# Patient Record
Sex: Male | Born: 1965 | Race: Black or African American | Hispanic: No | Marital: Married | Smoking: Never smoker
Health system: Southern US, Community
[De-identification: ages and names within clinical notes are randomized; demographics above are authoritative.]

## PROBLEM LIST (undated history)

## (undated) DIAGNOSIS — G4733 Obstructive sleep apnea (adult) (pediatric): Secondary | ICD-10-CM

## (undated) DIAGNOSIS — E669 Obesity, unspecified: Secondary | ICD-10-CM

## (undated) DIAGNOSIS — K219 Gastro-esophageal reflux disease without esophagitis: Secondary | ICD-10-CM

## (undated) DIAGNOSIS — Z9989 Dependence on other enabling machines and devices: Secondary | ICD-10-CM

## (undated) DIAGNOSIS — N289 Disorder of kidney and ureter, unspecified: Secondary | ICD-10-CM

## (undated) HISTORY — DX: Obesity, unspecified: E66.9

## (undated) HISTORY — DX: Dependence on other enabling machines and devices: Z99.89

## (undated) HISTORY — DX: Gastro-esophageal reflux disease without esophagitis: K21.9

## (undated) HISTORY — DX: Obstructive sleep apnea (adult) (pediatric): G47.33

---

## 1994-07-05 HISTORY — PX: TONSILLECTOMY AND ADENOIDECTOMY: SUR1326

## 2000-04-12 ENCOUNTER — Emergency Department (HOSPITAL_COMMUNITY): Admission: EM | Admit: 2000-04-12 | Discharge: 2000-04-12 | Payer: Self-pay | Admitting: Emergency Medicine

## 2001-01-09 ENCOUNTER — Emergency Department (HOSPITAL_COMMUNITY): Admission: EM | Admit: 2001-01-09 | Discharge: 2001-01-09 | Payer: Self-pay | Admitting: Emergency Medicine

## 2001-01-09 ENCOUNTER — Encounter: Payer: Self-pay | Admitting: Emergency Medicine

## 2001-01-13 ENCOUNTER — Emergency Department (HOSPITAL_COMMUNITY): Admission: EM | Admit: 2001-01-13 | Discharge: 2001-01-13 | Payer: Self-pay | Admitting: Emergency Medicine

## 2005-01-04 ENCOUNTER — Emergency Department (HOSPITAL_COMMUNITY): Admission: EM | Admit: 2005-01-04 | Discharge: 2005-01-04 | Payer: Self-pay | Admitting: Emergency Medicine

## 2005-10-13 ENCOUNTER — Encounter: Admission: RE | Admit: 2005-10-13 | Discharge: 2005-10-13 | Payer: Self-pay | Admitting: Gastroenterology

## 2007-03-19 IMAGING — CR DG BE W/ AIR HIGH DENSITY
8 of 10 series · 8 of 10 positions shown · non-contrast
Comparison: None.

CLINICAL DATA: Blood in the stools.
 DOUBLE CONTRAST BARIUM ENEMA WITH KUB:

[view not recorded (1 of 8)]
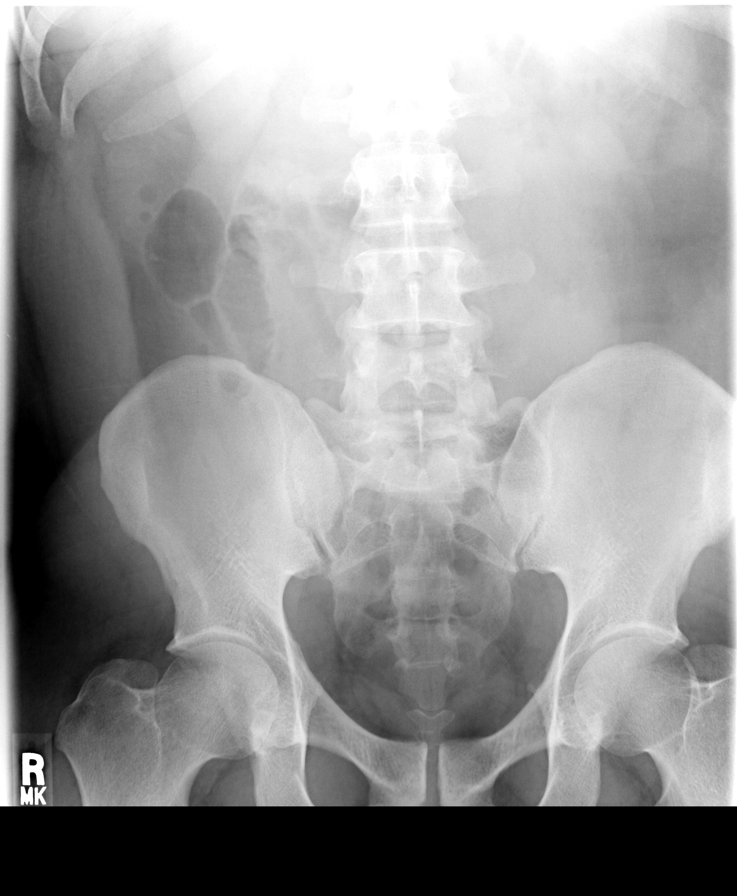

[view not recorded (2 of 8)]
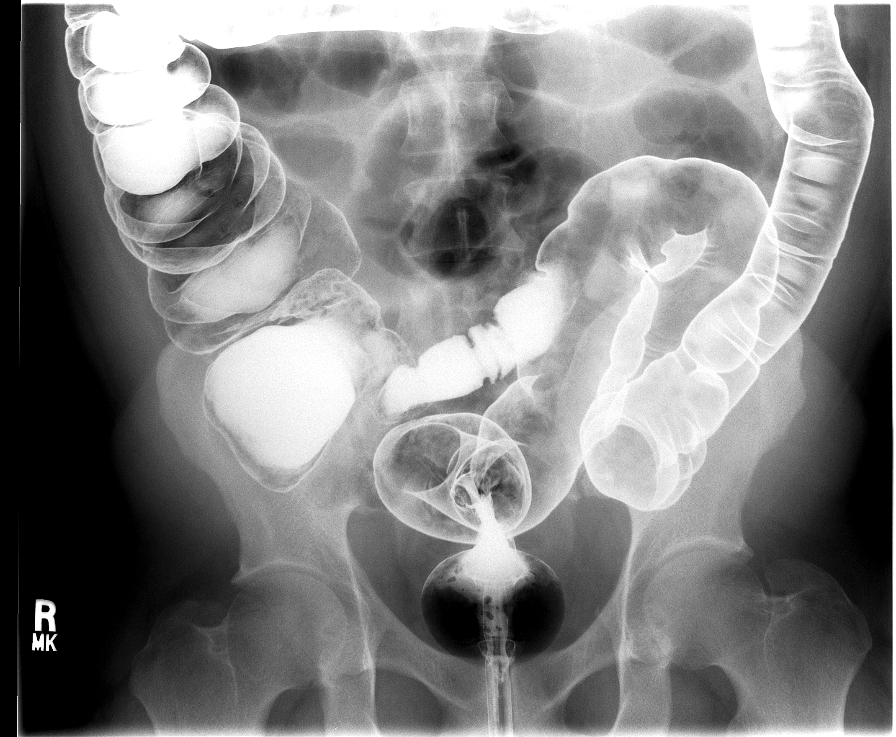

[view not recorded (3 of 8)]
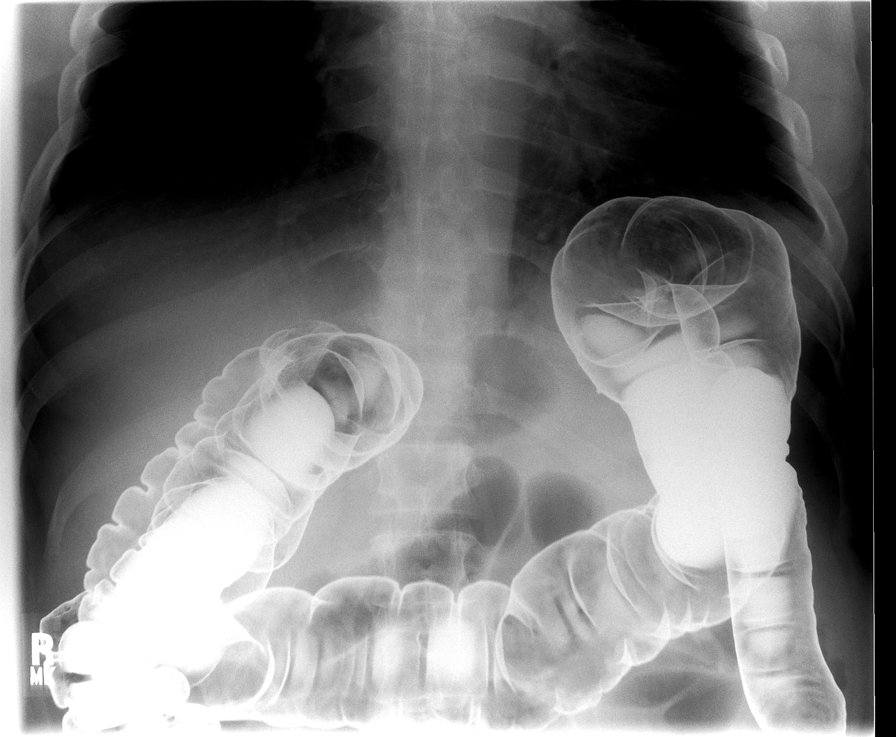

[view not recorded (4 of 8)]
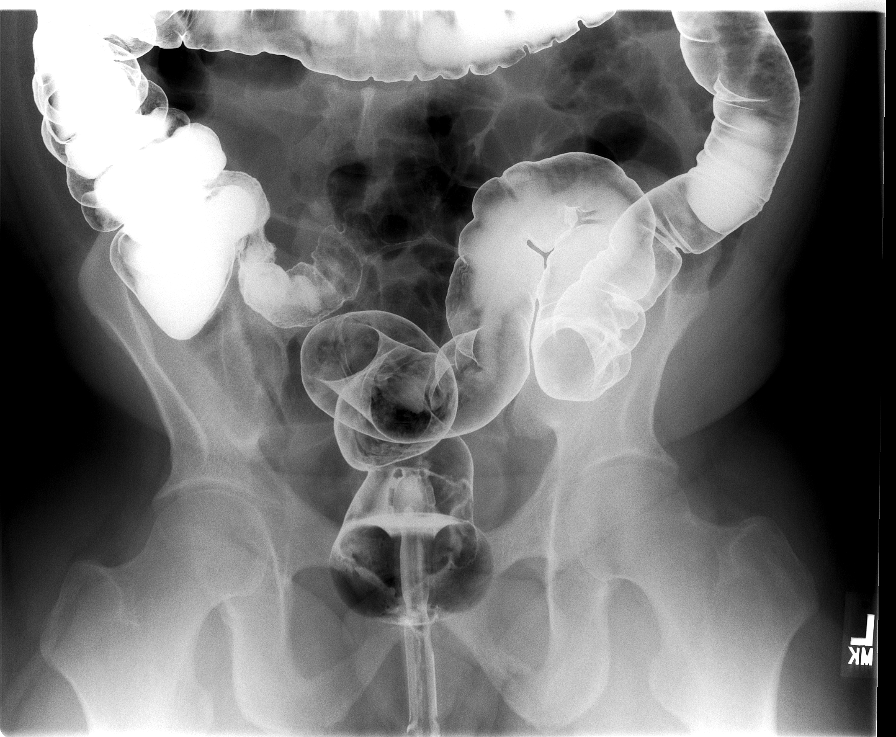

[view not recorded (5 of 8)]
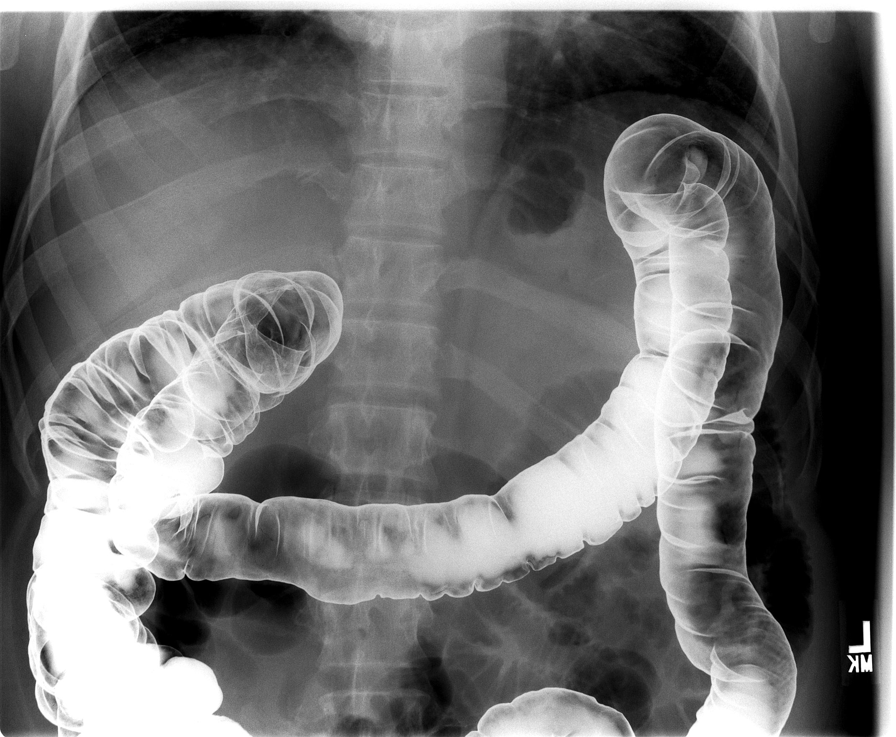

[view not recorded (6 of 8)]
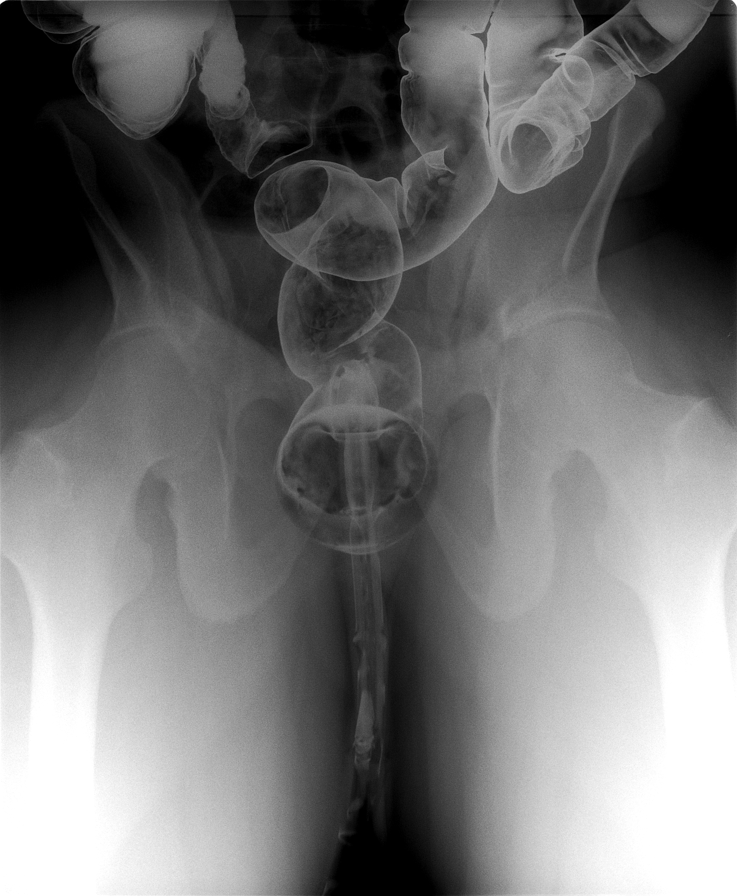

[view not recorded (7 of 8)]
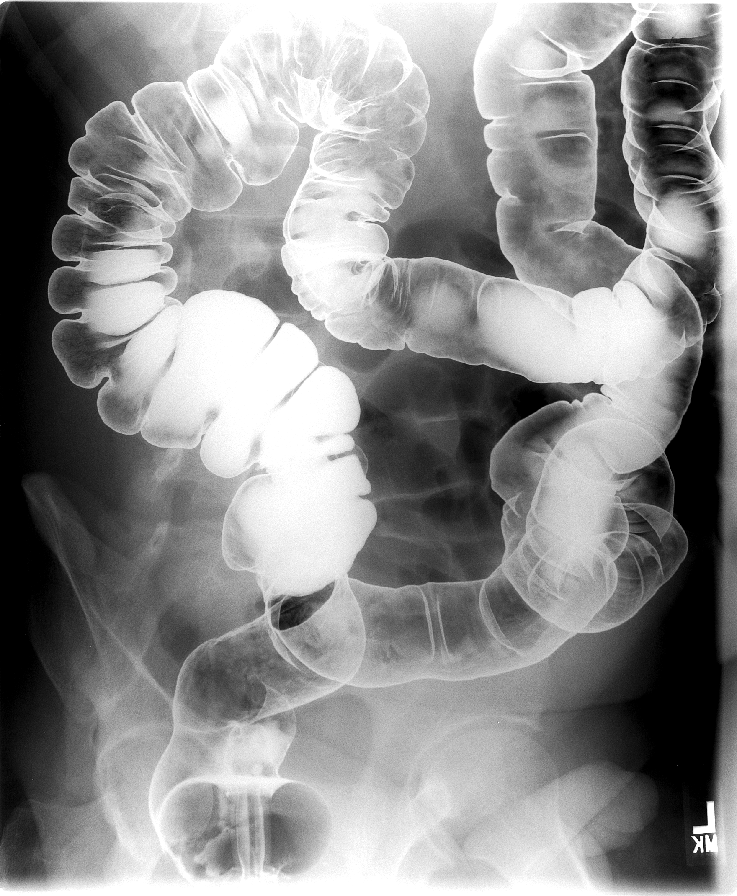

[view not recorded (8 of 8)]
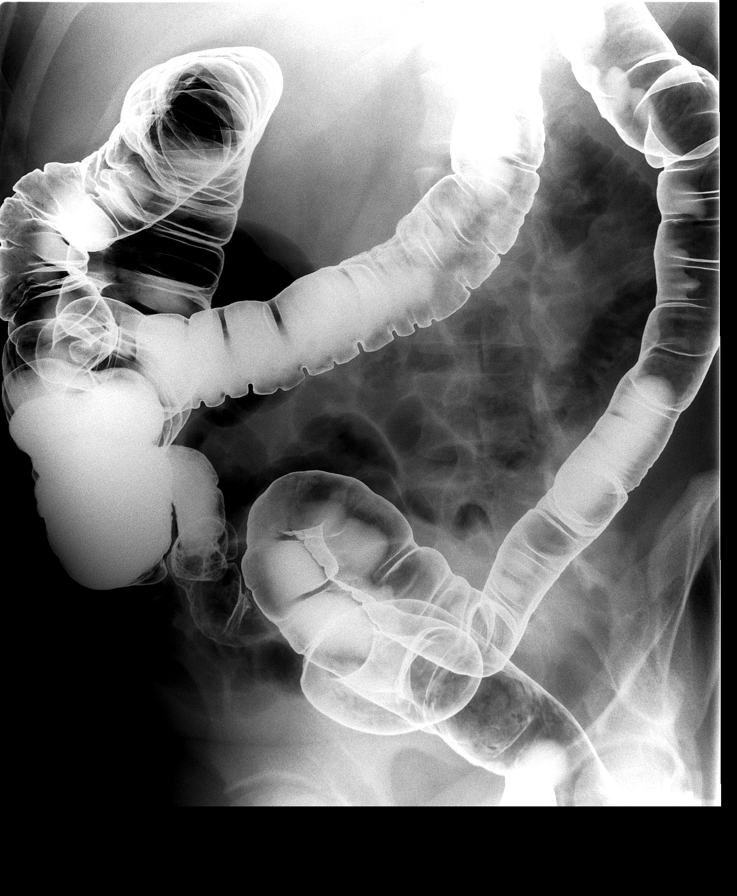

[8 of 10 positions shown; findings below may reference images not displayed]

FINDINGS: Initial KUB appears normal.  
 Double contrast barium enema was performed.  No polyp or mass is identified.  No ulceration or abnormality of the mucosal surface is detected.  The ileocecal valve region appears normal, as does the cecum.  Normal haustral pattern identified.  No significant abnormality of the sigmoid colon or rectum is detected.
IMPRESSION: Normal double contrast barium enema examination.  The cause of the patient's GI bleeding is occult.

## 2008-12-25 ENCOUNTER — Ambulatory Visit: Payer: Self-pay | Admitting: Internal Medicine

## 2008-12-25 ENCOUNTER — Inpatient Hospital Stay (HOSPITAL_COMMUNITY): Admission: EM | Admit: 2008-12-25 | Discharge: 2008-12-27 | Payer: Self-pay | Admitting: Emergency Medicine

## 2010-10-12 LAB — URINALYSIS, ROUTINE W REFLEX MICROSCOPIC
Bilirubin Urine: NEGATIVE
Glucose, UA: NEGATIVE mg/dL
Hgb urine dipstick: NEGATIVE
Hgb urine dipstick: NEGATIVE
Ketones, ur: NEGATIVE mg/dL
Nitrite: NEGATIVE
Protein, ur: NEGATIVE mg/dL
Protein, ur: NEGATIVE mg/dL
Specific Gravity, Urine: 1.026 (ref 1.005–1.030)
Urobilinogen, UA: 0.2 mg/dL (ref 0.0–1.0)
Urobilinogen, UA: 1 mg/dL (ref 0.0–1.0)
pH: 6 (ref 5.0–8.0)

## 2010-10-12 LAB — CBC
HCT: 36.1 % — ABNORMAL LOW (ref 39.0–52.0)
HCT: 47.2 % (ref 39.0–52.0)
Hemoglobin: 13.4 g/dL (ref 13.0–17.0)
Hemoglobin: 15.8 g/dL (ref 13.0–17.0)
MCHC: 33.3 g/dL (ref 30.0–36.0)
MCHC: 33.5 g/dL (ref 30.0–36.0)
MCV: 83.3 fL (ref 78.0–100.0)
MCV: 83.5 fL (ref 78.0–100.0)
MCV: 83.6 fL (ref 78.0–100.0)
Platelets: 274 10*3/uL (ref 150–400)
RBC: 4.32 MIL/uL (ref 4.22–5.81)
RBC: 4.83 MIL/uL (ref 4.22–5.81)
RBC: 5.66 MIL/uL (ref 4.22–5.81)
RDW: 14.3 % (ref 11.5–15.5)
WBC: 11.2 10*3/uL — ABNORMAL HIGH (ref 4.0–10.5)
WBC: 15.8 10*3/uL — ABNORMAL HIGH (ref 4.0–10.5)

## 2010-10-12 LAB — BASIC METABOLIC PANEL
BUN: 18 mg/dL (ref 6–23)
BUN: 19 mg/dL (ref 6–23)
CO2: 20 mEq/L (ref 19–32)
CO2: 21 mEq/L (ref 19–32)
CO2: 21 mEq/L (ref 19–32)
Calcium: 8.4 mg/dL (ref 8.4–10.5)
Chloride: 101 mEq/L (ref 96–112)
Chloride: 102 mEq/L (ref 96–112)
Chloride: 105 mEq/L (ref 96–112)
Creatinine, Ser: 1.43 mg/dL (ref 0.4–1.5)
Creatinine, Ser: 1.82 mg/dL — ABNORMAL HIGH (ref 0.4–1.5)
GFR calc Af Amer: 50 mL/min — ABNORMAL LOW (ref >60)
GFR calc Af Amer: >60 mL/min (ref >60)
GFR calc non Af Amer: 41 mL/min — ABNORMAL LOW (ref >60)
Glucose, Bld: 126 mg/dL — ABNORMAL HIGH (ref 70–99)
Glucose, Bld: 137 mg/dL — ABNORMAL HIGH (ref 70–99)
Potassium: 5.7 mEq/L — ABNORMAL HIGH (ref 3.5–5.1)
Potassium: 5.7 mEq/L — ABNORMAL HIGH (ref 3.5–5.1)
Sodium: 129 mEq/L — ABNORMAL LOW (ref 135–145)
Sodium: 130 mEq/L — ABNORMAL LOW (ref 135–145)

## 2010-10-12 LAB — DIFFERENTIAL
Basophils Absolute: 0 10*3/uL (ref 0.0–0.1)
Eosinophils Absolute: 0 10*3/uL (ref 0.0–0.7)
Lymphocytes Relative: 10 % — ABNORMAL LOW (ref 12–46)
Lymphocytes Relative: 18 % (ref 12–46)
Lymphs Abs: 1.1 10*3/uL (ref 0.7–4.0)
Monocytes Absolute: 0.5 10*3/uL (ref 0.1–1.0)
Monocytes Absolute: 0.6 10*3/uL (ref 0.1–1.0)
Monocytes Relative: 4 % (ref 3–12)
Monocytes Relative: 4 % (ref 3–12)
Neutro Abs: 9.6 10*3/uL — ABNORMAL HIGH (ref 1.7–7.7)

## 2010-10-12 LAB — COMPREHENSIVE METABOLIC PANEL
BUN: 18 mg/dL (ref 6–23)
CO2: 23 mEq/L (ref 19–32)
Chloride: 109 mEq/L (ref 96–112)
Creatinine, Ser: 1.28 mg/dL (ref 0.4–1.5)
GFR calc non Af Amer: >60 mL/min (ref >60)
Glucose, Bld: 140 mg/dL — ABNORMAL HIGH (ref 70–99)
Total Bilirubin: 0.3 mg/dL (ref 0.3–1.2)

## 2010-10-12 LAB — URINE CULTURE

## 2010-11-17 NOTE — Discharge Summary (Signed)
Marvin Gonzalez, MAN NO.:  [id number]   MEDICAL RECORD NO.:  [id number]          PATIENT TYPE:  INP   LOCATION:  1407                         FACILITY:  [facility]   PHYSICIAN:  Ramiro Harvest, MD    DATE OF BIRTH:  June 11, 1966   DATE OF ADMISSION:  12/25/2008  DATE OF DISCHARGE:  12/27/2008                               DISCHARGE SUMMARY   PRIMARY CARE PHYSICIAN:  Dr. Crawford Givens of [facility].   DISCHARGE DIAGNOSES:  1. Probable allergic reaction.  2. Hyponatremia, resolved.  3. Acute renal insufficiency, resolved.  4. Dehydration.  5. Left cheek abscess.  6. Leukocytosis, steroid-induced.  7. Hyperkalemia, resolved.   DISCHARGE MEDICATIONS:  1. Prednisone 60 mg p.o. b.i.d. x2 days, then 60 mg p.o. daily x2      days, then 40 mg p.o. daily x2 days, then 20 mg p.o. daily x2 days.  2. Pepcid 20 mg p.o. b.i.d. x3 days.  3. Benadryl 25 mg p.o. b.i.d. x3 days.  4. Motrin 200 mg q.i.d. p.r.n.  5. Doxycycline 100 mg p.o. b.i.d. x5 more days.   DISPOSITION AND FOLLOW-UP:  The patient will be discharged home.  The  patient is to follow up with his PCP in 1 week.  On follow-up a basic  metabolic profile needs to be checked to follow up on the patient's  electrolytes and renal function.  a CBC will also need to be checked to  follow up on the patient's leukocytosis, post-discharge, as this was  felt to be steroid-induced.   CONSULTATIONS DONE:  None.   PROCEDURES PERFORMED.:  A chest x-ray was done on December 27, 2008 that  showed no acute disease.   BRIEF ADMISSION HISTORY AND PHYSICAL:  [name] is a 45 year old  gentleman who presented to his PCP on December 16, 2008, secondary to an  abscess on his left cheek.  The patient was started on Bactrim, took  that for about 7 days and then he developed hives on December 23, 2008 in  the evening, went to the Urgent Care Center on June 22.  At the Urgent  Care Center, the patient was given a steroid injection,  switched to oral  doxycycline.  He went home and took his as needed Benadryl and Motrin  due to a temperature of 100.  The night prior to admission the patient  developed peripheral swelling, as well as some leg swelling and felt  like a lump in his throat.  The patient developed some shortness of  breath on exertion.  He presented to the ED for further evaluation and  treatment.   PHYSICAL EXAM:  Per admitting physician.  VITAL SIGNS:  Temperature of 99.4, blood pressure 117/48, heart rate of  104, respirations 16, satting 95% on room air.  GENERAL: The patient is an obese African American male in no acute  distress.  HEENT: Normocephalic, atraumatic.  Pupils equal, round and reactive to  light and accommodation.  Extraocular movements intact.  Oropharynx  clear.  No lesions.  No exudates.  NECK: Supple.  No lymphadenopathy.  RESPIRATORY:  Clear  breath sounds bilaterally throughout.  No wheezes,  no rales, no rhonchi.  No increased work of breathing.  CARDIOVASCULAR:  Tachycardiac, regular rhythm.  ABDOMEN:  Soft, nontender, nondistended, positive bowel sounds.  PSYCHIATRIC:  The patient was alert and oriented x3, calm and pleasant.  NEUROLOGICALLY:  The patient was moving all extremities.  Speech was  clear.  Cranial nerves II-XII are grossly intact.  SKIN: Large hives were noted to be scattered diffusely, including his  back and his legs.  EXTREMITIES:  He had positive peripheral edema as noted especially in  his hands.  ENT:  Mild lip-swelling noted.  No tongue swelling.  A small area of  induration of approximately 1 inch on his left cheek without drainage.   ADMISSION LABS:  Please see history and physical that was dictated.   HOSPITAL COURSE:  1. Probable allergic reaction:  The patient was admitted with a      probable allergic reaction, felt to be secondary to his Bactrim.      He was admitted to monitor him for angioedema.  The patient was      placed on IV Solu-Medrol,  IV Pepcid and IV Benadryl, as well as      supportive care with IV fluids.  The patient was monitored and      improved clinically on a daily basis.  On hospital day #2 the      patient did complain of some itching, also did have some swelling      in his hands.  However, his lip swelling had improved and he felt      that he was improving slowly.  The patient's Solu-Medrol was      subsequently tapered.  He was maintained on his IV Pepcid and      Benadryl and monitored.  The patient remained afebrile throughout      the hospitalization and a CBC obtained showed increase in his white      count.  A urinalysis and chest x-ray were obtained which were      negative and it was felt the patient's leukocytosis was likely      secondary to his steroids.  The patient improved clinically during      the hospitalization such that by day of discharge the patient was      in a stable and improved condition.  The patient's lip-swelling had      resolved.  The patient's peripheral edema, especially in his hands,      had improved significantly.  The patient was no longer itching and      the patient will be discharged home on a steroid taper, as well as      the Pepcid and Benadryl taper to follow up with his PCP in 1 week      post-discharge.  2. Hyponatremia.  The patient was admitted, noted on admission to be      hyponatremic with a sodium of 129.  It was felt the patient's      hyponatremia was likely secondary to hypovolemic hyponatremia.  A      TSH was obtained which was within normal limits.  A random cortisol      level was obtained which was also within normal limits.  Chest x-      ray which was obtained, as stated above, was negative.  The patient      was hydrated with IV fluids during the hospitalization with      improvement  in his sodium levels such that by day of discharge, the      patient's hyponatremia had resolved and his sodium was 136 by day      of discharge.  The patient  will need a follow-up BMET with his PCP      on follow-up.  3. Dehydration.  On admission, the patient was noted to be also      dehydrated with borderline low blood pressures.  The patient was      hydrated with IV fluids and was euvolemic by day of discharge.  4. Acute renal insufficiency:  On admission the patient was noted to      be in acute renal insufficiency with a creatinine of 1.82 and a GFR      of 50.  It was felt this was prerenal in nature.  The patient was      hydrated with IV fluids with improvement in his renal function such      that, by day of discharge, the patient's acute renal insufficiency      had resolved.  His creatinine on day of discharge was 1.28 with a      GFR of greater than 60.  5. Left cheek abscess:  This remained stable during the      hospitalization, improved clinically.  The patient was maintained      on doxycycline that he had been taking orally as an outpatient.      The patient will be discharged back home to complete a 5-day course      of his doxycycline with followup with his PCP as an outpatient.  6. Leukocytosis:  On hospital day #2, the patient was noted to have a      leukocytosis with an increase in his white count of up to 15.8.  A      urinalysis was obtained which was within normal limits.  Chest x-      ray was also obtained which was negative for any infiltrate.  The      patient was asymptomatic, afebrile, and it was felt that the      patient's leukocytosis was likely steroid-induced.  This should      improve as the patient is tapered off his steroids and repeat CBC      will need to be obtained on follow-up to monitor his leukocytosis.   On day of discharge the patient was in stable and improved condition and  will be discharged home in stable condition with close follow-up with  PCP.   DISCHARGE VITAL SIGNS:  Temperature 98.0, blood pressure 113/39, pulse  of 87, respirations 20, satting 97% on room air.   DISCHARGE LABS:   CBC with white count 15.8, hemoglobin 12.1, platelets  of 230, hematocrit 36.1.  Sodium of 136, potassium 4.6, chloride 109,  bicarb 23, BUN 18, creatinine 1.28, glucose of 140, calcium of 7.4,  bilirubin of 0.3, alk phosphatase 41, AST 19, ALT 20, protein of 5.0,  albumin of 2.4.  TSH of 0.406.  Random cortisol level of 17.1.  Chest x-  ray with no acute cardiopulmonary disease.  Urinalysis was nitrite  negative and leukocytes negative.   It was a pleasure taking care of [name].      Ramiro Harvest, MD  Electronically Signed     DT/MEDQ  D:  12/27/2008  T:  12/27/2008  Job:  [id number]   cc:   Dwana Curd. Para March, M.D.  Fax: [phone]

## 2010-11-17 NOTE — H&P (Signed)
Marvin Gonzalez, Marvin Gonzalez                ACCOUNT NO.:  [id number]   MEDICAL RECORD NO.:  [id number]          PATIENT TYPE:  EMS   LOCATION:  ED                           FACILITY:  [facility]   PHYSICIAN:  Raenette Rover. Felicity Coyer, MDDATE OF BIRTH:  11/06/1965   DATE OF ADMISSION:  12/25/2008  DATE OF DISCHARGE:                              HISTORY & PHYSICAL   CHIEF COMPLAINT:  Allergic reaction.   HISTORY OF PRESENT ILLNESS:  Mr. Conley is a 45 year old male who  presented to his primary care on December 16, 2008 secondary to an abscess  on his left cheek.  He was started on Bactrim and took this for 7 days.  He then developed hives on December 23, 2008 in the evening and went to the  urgent care on June 22.  At urgent care, he was given a steroid  injection and switched to oral doxycycline.  He went home and took  p.r.n. Benadryl and Motrin due to a temperature of 100.  Last night, he  then developed peripheral swelling as well as some lid swelling and felt  like a lump in his throat.  He developed some dyspnea with exertion.  He presents here to the emergency department for further evaluation and  treatment.   ALLERGIES:  BACTRIM.   PAST MEDICAL HISTORY:  1. GERD.  2. Obesity.   PAST SURGICAL HISTORY:  Tonsillectomy and adenoidectomy in 1996.   SOCIAL HISTORY:  Patient is married.  He has 3 children.  He works as a  Engineer, civil (consulting).  He denies tobacco, drugs, or alcohol use.   FAMILY HISTORY:  His mother is alive.  She is alive and well.  Father is  also alive.  He has chronic heart problems, glaucoma, and diabetes.  Reports his children are alive and well.  His maternal grandfather had  heart problems, and his maternal grandmother had thyroid problems as  well as diabetes type 2.   MEDICATIONS:  1. Motrin 200 mg p.o. q.i.d. p.r.n.  2. Benadryl 25 mg p.o. p.r.n.  3. Doxycycline 100 mg p.o. b.i.d.   REVIEW OF SYSTEMS:  ENT:  Reports positive swelling of his lips and some  throat fullness.  GENERAL:   Temp is 100.  CARDIOVASCULAR:  No chest  pain.  RESPIRATORY:  Positive shortness of breath with exertion.  GI:  Positive nausea.  No vomiting.  Did have a loose stool last night as  well as this morning.  MUSCULOSKELETAL:  No muscular or joint pain.  PSYCHIATRIC:  Denies depression or anxiety.  NEURO:  No vision or  hearing changes.  SKIN:  Notes diffuse hives.  HEME:  No bruising, no  bleeding.   LABS/RADIOLOGY:  Sodium 129, potassium 5.7, chloride 101, bicarb 20, BUN  19, creatinine 1.86, glucose 126.  White blood cell count 0.2,  hemoglobin 15.8, hematocrit 47.2, platelets 274.  Urinalysis negative.   PHYSICAL EXAMINATION:  BP 117/48, heart rate 104, respiratory rate 16,  temperature 99.4, O2 95% on room air.  GENERAL:  He is an obese African American male in no acute distress.  EYES:  No  discharge.  Nonicteric.  CARDIOVASCULAR:  S1 and S2.  Mild tachycardia.  RESPIRATORY:  Breath sounds clear to auscultation bilaterally without  wheezes, rales or rhonchi.  No increased work of breathing.  ABDOMEN:  Soft, nontender, nondistended.  Positive bowel sounds.  PSYCHIATRIC:  A and O x3.  Calm and pleasant.  NEURO:  Moving all extremities.  Speech is clear.  Cranial nerves II-XII  grossly intact.  SKIN:  Large hives noted to be scattered diffusely, including back and  legs.  EXTREMITIES:  Positive peripheral edema is noted, especially in the  hands.  ENT:  Mild lip swelling noted.  No tongue swelling.  A small area of  induration of approximately 1 inch, left cheek, without drainage.   ASSESSMENT/PLAN:  1. Allergic reaction to Bactrim, rule out early angioedema.  Will      treat with IV Solu-Medrol, IV Pepcid, IV Benadryl, monitor on      telemetry bed.  2. Abscess, left cheek.  This is improving, per the patient, but it      remains itchy.  We will continue the patient on oral doxycycline.  3. Hyponatremia, likely secondary to edema.  We will hydrate with      __________.  4.  Hyperkalemia:  Question if this is a hemolyzed sample.  Will repeat      BMET.      Sandford Craze, NP      Raenette Rover. Felicity Coyer, MD  Electronically Signed    MO/MEDQ  D:  12/25/2008  T:  12/25/2008  Job:  [id number]   cc:   Dwana Curd. Para March, M.D.  Fax: [phone]

## 2014-11-21 ENCOUNTER — Other Ambulatory Visit: Payer: Self-pay | Admitting: Family Medicine

## 2014-11-21 DIAGNOSIS — R7989 Other specified abnormal findings of blood chemistry: Secondary | ICD-10-CM

## 2014-11-29 ENCOUNTER — Ambulatory Visit
Admission: RE | Admit: 2014-11-29 | Discharge: 2014-11-29 | Disposition: A | Discharge status: Home / Self Care | Payer: No Typology Code available for payment source | Source: Ambulatory Visit | Attending: Family Medicine | Admitting: Family Medicine

## 2014-11-29 DIAGNOSIS — R7989 Other specified abnormal findings of blood chemistry: Secondary | ICD-10-CM

## 2021-01-03 ENCOUNTER — Other Ambulatory Visit: Payer: Self-pay

## 2021-01-03 ENCOUNTER — Encounter: Payer: Self-pay | Admitting: Emergency Medicine

## 2021-01-03 ENCOUNTER — Ambulatory Visit
Admission: EM | Admit: 2021-01-03 | Discharge: 2021-01-03 | Disposition: A | Discharge status: Home / Self Care | Payer: No Typology Code available for payment source | Attending: Family Medicine | Admitting: Family Medicine

## 2021-01-03 DIAGNOSIS — R0981 Nasal congestion: Secondary | ICD-10-CM

## 2021-01-03 DIAGNOSIS — J069 Acute upper respiratory infection, unspecified: Secondary | ICD-10-CM | POA: Diagnosis not present

## 2021-01-03 MED ORDER — PREDNISONE 20 MG PO TABS: 40.0000 mg | ORAL_TABLET | Freq: Every day | ORAL | 0 refills | Status: DC

## 2021-01-03 NOTE — ED Triage Notes (Signed)
Patient c/o nasal congestion x 3 days.   Patient endorses a fever with a highest temperature of 102 F at home.   Patient endorses a productive cough w/ "brown, thick" mucus.   Patient has a rapid COVID-19 test at work with a negative test result.   Patient endorses decreased appetitive, stating " I haven't eaten since Wednesday".   Patient has taken OTC "cold and sinus" medication w/ some relief of symptoms.

## 2021-01-05 LAB — NOVEL CORONAVIRUS, NAA: SARS-CoV-2, NAA: DETECTED — AB

## 2021-01-05 LAB — SARS-COV-2, NAA 2 DAY TAT

## 2021-01-05 NOTE — ED Provider Notes (Signed)
[facility]   [id number] 01/03/21 Arrival Time: 1036  ASSESSMENT & PLAN:  1. Viral URI   2. Nasal congestion    Discussed typical duration of obvious viral illnesses. COVID-19 testing sent. OTC symptom care as needed.  Given amount of nasal congestion, begin trial of: Meds ordered this encounter  Medications   predniSONE (DELTASONE) 20 MG tablet    Sig: Take 2 tablets (40 mg total) by mouth daily.    Dispense:  10 tablet    Refill:  0   Understands steroids will raise blood sugar.  Recommend:  Follow-up Information     [facility] [place].   Specialty: Family Medicine Why: If worsening or failing to improve as anticipated. Contact information: [address] [place] [address] [phone]                 Reviewed expectations re: course of current medical issues. Questions answered. Outlined signs and symptoms indicating need for more acute intervention. Understanding verbalized. After Visit Summary given.   SUBJECTIVE: History from: patient. Marvin Gonzalez is a 55 y.o. male who reports nasal congestion and coughing; abrupt onset 3 d ago. T 102 F yesterday. Rapid COVID at work yesterday; negative. Overall decreased appetite. No n/v/d. OTC medications without much relief. Denies: difficulty breathing. Normal PO intake without n/v/d.   OBJECTIVE:  Vitals:   01/03/21 1213 01/03/21 1222  BP: 113/73   Pulse: 83   Resp: 18   Temp: 97.9 F (36.6 C)   TempSrc: Oral   SpO2: 97%   Weight:  117.9 kg  Height:  5\' 7"  (1.702 m)    General appearance: alert; no distress Eyes: PERRLA; EOMI; conjunctiva normal HENT: [place]; AT; with nasal congestion Neck: supple  Lungs: speaks full sentences without difficulty; unlabored; dry cough Extremities: no edema Skin: warm and dry Neurologic: normal gait Psychological: alert and cooperative; normal mood and affect  Labs: Labs Reviewed  NOVEL CORONAVIRUS, NAA    Allergies   Allergen Reactions   Sulfa Antibiotics Rash    Past Medical History:  Diagnosis Date   GERD (gastroesophageal reflux disease)    Obesity    OSA on CPAP    Social History   Socioeconomic History   Marital status: Married    Spouse name: Not on file   Number of children: Not on file   Years of education: Not on file   Highest education level: Not on file  Occupational History   Not on file  Tobacco Use   Smoking status: Never   Smokeless tobacco: Never  Substance and Sexual Activity   Alcohol use: Not on file   Drug use: Not on file   Sexual activity: Not on file  Other Topics Concern   Not on file  Social History Narrative   Not on file   Social Determinants of Health   Financial Resource Strain: Not on file  Food Insecurity: Not on file  Transportation Needs: Not on file  Physical Activity: Not on file  Stress: Not on file  Social Connections: Not on file  Intimate Partner Violence: Not on file   Family History  Problem Relation Age of Onset   Diabetes Mellitus I Father    Hypertension Father    Coronary artery disease Father    Diabetes Mellitus I Sister    Hypertension Sister    Past Surgical History:  Procedure Laterality Date   TONSILLECTOMY AND ADENOIDECTOMY  1996     , MD  01/05/21 1102  

## 2021-09-11 ENCOUNTER — Ambulatory Visit
Admission: EM | Admit: 2021-09-11 | Discharge: 2021-09-11 | Disposition: A | Discharge status: Home / Self Care | Payer: No Typology Code available for payment source | Attending: Internal Medicine | Admitting: Internal Medicine

## 2021-09-11 ENCOUNTER — Other Ambulatory Visit: Payer: Self-pay

## 2021-09-11 DIAGNOSIS — J069 Acute upper respiratory infection, unspecified: Secondary | ICD-10-CM | POA: Diagnosis not present

## 2021-09-11 MED ORDER — BENZONATATE 100 MG PO CAPS: 100.0000 mg | ORAL_CAPSULE | Freq: Three times a day (TID) | ORAL | 0 refills | Status: DC | PRN

## 2021-09-11 MED ORDER — PREDNISONE 20 MG PO TABS: 40.0000 mg | ORAL_TABLET | Freq: Every day | ORAL | 0 refills | Status: AC

## 2021-09-11 NOTE — ED Triage Notes (Signed)
Pt c/o cough, nasal congestion, chest tightness,  ? ?Onset ~ last night  ?

## 2021-09-11 NOTE — ED Provider Notes (Signed)
?[facility] ? ? ? ?CSN: [id number] ?Arrival date & time: 09/11/21  1109 ? ? ?  ? ?History   ?Chief Complaint ?Chief Complaint  ?Patient presents with  ? Cough  ? ? ?HPI ?Marvin Gonzalez is a 56 y.o. male.  ? ?Patient presents with nonproductive cough and nasal congestion that has been present for a few days.  Cough developed yesterday.  Patient denies any known fevers or sick contacts.  Denies chest pain, shortness of breath, ear pain, sore throat, nausea, vomiting, diarrhea, abdominal pain.  Patient does not report taking any medications to alleviate symptoms. ? ? ?Cough ? ?Past Medical History:  ?Diagnosis Date  ? GERD (gastroesophageal reflux disease)   ? Obesity   ? OSA on CPAP   ? ? ?There are no problems to display for this patient. ? ? ?Past Surgical History:  ?Procedure Laterality Date  ? TONSILLECTOMY AND ADENOIDECTOMY  1996  ? ? ? ? ? ?Home Medications   ? ?Prior to Admission medications   ?Medication Sig Start Date End Date Taking? Authorizing Provider  ?benzonatate (TESSALON) 100 MG capsule Take 1 capsule (100 mg total) by mouth every 8 (eight) hours as needed for cough. 09/11/21  Yes Gustavus BryantMound, Deseree Zemaitis E, FNP  ?predniSONE (DELTASONE) 20 MG tablet Take 2 tablets (40 mg total) by mouth daily for 5 days. 09/11/21 09/16/21 Yes Marilynn Ekstein, Acie FredricksonHaley E, FNP  ?allopurinol (ZYLOPRIM) 300 MG tablet Take 300 mg by mouth daily. 09/20/20   [provider]  ?pantoprazole (PROTONIX) 40 MG tablet Take 40 mg by mouth daily.    [provider]  ?spironolactone (ALDACTONE) 25 MG tablet Take 25 mg by mouth daily. 11/20/20   [provider]  ? ? ?Family History ?Family History  ?Problem Relation Age of Onset  ? Diabetes Mellitus I Father   ? Hypertension Father   ? Coronary artery disease Father   ? Diabetes Mellitus I Sister   ? Hypertension Sister   ? ? ?Social History ?Social History  ? ?Tobacco Use  ? Smoking status: Never  ? Smokeless tobacco: Never  ? ? ? ?Allergies   ?Sulfa antibiotics ? ? ?Review of  Systems ?Review of Systems ?Per HPI ? ?Physical Exam ?Triage Vital Signs ?ED Triage Vitals  ?Enc Vitals Group  ?   BP 09/11/21 1208 (!) 164/82  ?   Pulse Rate 09/11/21 1208 93  ?   Resp 09/11/21 1208 18  ?   Temp 09/11/21 1208 98.9 ?F (37.2 ?C)  ?   Temp Source 09/11/21 1208 Oral  ?   SpO2 09/11/21 1208 96 %  ?   Weight --   ?   Height --   ?   Head Circumference --   ?   Peak Flow --   ?   Pain Score 09/11/21 1209 0  ?   Pain Loc --   ?   Pain Edu? --   ?   Excl. in GC? --   ? ?No data found. ? ?Updated Vital Signs ?BP (!) 164/82 (BP Location: Right Arm)   Pulse 93   Temp 98.9 ?F (37.2 ?C) (Oral)   Resp 18   SpO2 96%  ? ?Visual Acuity ?Right Eye Distance:   ?Left Eye Distance:   ?Bilateral Distance:   ? ?Right Eye Near:   ?Left Eye Near:    ?Bilateral Near:    ? ?Physical Exam ?Constitutional:   ?   General: He is not in acute distress. ?   Appearance:  Normal appearance. He is not toxic-appearing or diaphoretic.  ?HENT:  ?   Head: Normocephalic and atraumatic.  ?   Right Ear: Tympanic membrane and ear canal normal.  ?   Left Ear: Tympanic membrane and ear canal normal.  ?   Nose: Congestion present.  ?   Mouth/Throat:  ?   Mouth: Mucous membranes are moist.  ?   Pharynx: No posterior oropharyngeal erythema.  ?Eyes:  ?   Extraocular Movements: Extraocular movements intact.  ?   Conjunctiva/sclera: Conjunctivae normal.  ?   Pupils: Pupils are equal, round, and reactive to light.  ?Cardiovascular:  ?   Rate and Rhythm: Normal rate and regular rhythm.  ?   Pulses: Normal pulses.  ?   Heart sounds: Normal heart sounds.  ?Pulmonary:  ?   Effort: Pulmonary effort is normal. No respiratory distress.  ?   Breath sounds: Normal breath sounds. No stridor. No wheezing, rhonchi or rales.  ?Abdominal:  ?   General: Abdomen is flat. Bowel sounds are normal.  ?   Palpations: Abdomen is soft.  ?Musculoskeletal:     ?   General: Normal range of motion.  ?   Cervical back: Normal range of motion.  ?Skin: ?   General: Skin is warm  and dry.  ?Neurological:  ?   General: No focal deficit present.  ?   Mental Status: He is alert and oriented to person, place, and time. Mental status is at baseline.  ?Psychiatric:     ?   Mood and Affect: Mood normal.     ?   Behavior: Behavior normal.  ? ? ? ?UC Treatments / Results  ?Labs ?(all labs ordered are listed, but only abnormal results are displayed) ?Labs Reviewed  ?COVID-19, FLU A+B NAA  ? ? ?EKG ? ? ?Radiology ?No results found. ? ?Procedures ?Procedures (including critical care time) ? ?Medications Ordered in UC ?Medications - No data to display ? ?Initial Impression / Assessment and Plan / UC Course  ?I have reviewed the triage vital signs and the nursing notes. ? ?Pertinent labs & imaging results that were available during my care of the patient were reviewed by me and considered in my medical decision making (see chart for details). ? ?  ? ?Patient presents with symptoms likely from a viral upper respiratory infection. Differential includes bacterial pneumonia, sinusitis, allergic rhinitis, COVID-19, flu. Do not suspect underlying cardiopulmonary process. Symptoms seem unlikely related to ACS, CHF or COPD exacerbations, pneumonia, pneumothorax. Patient is nontoxic appearing and not in need of emergent medical intervention. ? ?Recommended symptom control with over the counter medications.  Patient sent prescriptions.  Will treat with prednisone given patient's severe nasal congestion and to help alleviate inflammation. ? ?Return if symptoms fail to improve in 1-2 weeks or you develop shortness of breath, chest pain, severe headache. Patient states understanding and is agreeable. ? ?Discharged with PCP followup.  ?Final Clinical Impressions(s) / UC Diagnoses  ? ?Final diagnoses:  ?Viral upper respiratory tract infection with cough  ? ? ? ?Discharge Instructions   ? ?  ?It appears that you have a viral upper respiratory infection that should self resolve in the next few days with symptomatic  treatment.  You have been prescribed 2 medications to help alleviate symptoms.  Please follow-up if symptoms persist or persist. ? ? ? ?ED Prescriptions   ? ? Medication Sig Dispense Auth. Provider  ? predniSONE (DELTASONE) 20 MG tablet Take 2 tablets (40 mg total) by mouth daily  for 5 days. 10 tablet [place], [place] E, [place]  ? benzonatate (TESSALON) 100 MG capsule Take 1 capsule (100 mg total) by mouth every 8 (eight) hours as needed for cough. 21 capsule Ervin Knack E, [place]  ? ?  ? ?PDMP not reviewed this encounter. ?  ?Gustavus Bryant, [place] ?09/11/21 1313 ? ?

## 2021-09-11 NOTE — Discharge Instructions (Addendum)
It appears that you have a viral upper respiratory infection that should self resolve in the next few days with symptomatic treatment.  You have been prescribed 2 medications to help alleviate symptoms.  Please follow-up if symptoms persist or persist. ?

## 2021-09-13 LAB — COVID-19, FLU A+B NAA
Influenza A, NAA: NOT DETECTED
Influenza B, NAA: NOT DETECTED
SARS-CoV-2, NAA: NOT DETECTED

## 2021-12-19 ENCOUNTER — Ambulatory Visit
Admission: EM | Admit: 2021-12-19 | Discharge: 2021-12-19 | Disposition: A | Discharge status: Home / Self Care | Payer: No Typology Code available for payment source | Attending: Internal Medicine | Admitting: Internal Medicine

## 2021-12-19 DIAGNOSIS — J069 Acute upper respiratory infection, unspecified: Secondary | ICD-10-CM | POA: Diagnosis not present

## 2021-12-19 MED ORDER — FLUTICASONE PROPIONATE 50 MCG/ACT NA SUSP: 1.0000 | Freq: Every day | NASAL | 0 refills | Status: DC

## 2021-12-19 MED ORDER — BENZONATATE 100 MG PO CAPS: 100.0000 mg | ORAL_CAPSULE | Freq: Three times a day (TID) | ORAL | 0 refills | Status: DC | PRN

## 2021-12-19 NOTE — ED Triage Notes (Signed)
Pt states nasal congestion and coughing for the past weeks. Pt states he took Tylenol this morning.

## 2021-12-19 NOTE — ED Provider Notes (Signed)
[facility]    CSN: [id number] Arrival date & time: 12/19/21  0948      History   Chief Complaint Chief Complaint  Patient presents with   Nasal Congestion    HPI Mani Ruckert is a 56 y.o. male.   Patient presents with nasal congestion and coughing that started about 3 days ago.  Denies any known sick contacts.  Denies any known fevers.  Patient has taken Tylenol for symptoms with minimal improvement.  Denies chest pain, shortness of breath, sore throat, ear pain, nausea, vomiting, diarrhea, abdominal pain.  Denies history of asthma or COPD.     Past Medical History:  Diagnosis Date   GERD (gastroesophageal reflux disease)    Obesity    OSA on CPAP     There are no problems to display for this patient.   Past Surgical History:  Procedure Laterality Date   TONSILLECTOMY AND ADENOIDECTOMY  1996       Home Medications    Prior to Admission medications   Medication Sig Start Date End Date Taking? Authorizing Provider  benzonatate (TESSALON) 100 MG capsule Take 1 capsule (100 mg total) by mouth every 8 (eight) hours as needed for cough. 12/19/21  Yes Quinnton Bury, Michele Rockers, FNP  fluticasone (FLONASE) 50 MCG/ACT nasal spray Place 1 spray into both nostrils daily for 3 days. 12/19/21 12/22/21 Yes Rakwon Letourneau, Michele Rockers, FNP  allopurinol (ZYLOPRIM) 300 MG tablet Take 300 mg by mouth daily. 09/20/20   [provider]  pantoprazole (PROTONIX) 40 MG tablet Take 40 mg by mouth daily.    [provider]  spironolactone (ALDACTONE) 25 MG tablet Take 25 mg by mouth daily. 11/20/20   [provider]    Family History Family History  Problem Relation Age of Onset   Diabetes Mellitus I Father    Hypertension Father    Coronary artery disease Father    Diabetes Mellitus I Sister    Hypertension Sister     Social History Social History   Tobacco Use   Smoking status: Never   Smokeless tobacco: Never     Allergies   Sulfa antibiotics   Review  of Systems Review of Systems Per HPI  Physical Exam Triage Vital Signs ED Triage Vitals  Enc Vitals Group     BP 12/19/21 1029 123/78     Pulse Rate 12/19/21 1029 89     Resp 12/19/21 1029 18     Temp 12/19/21 1029 98.1 F (36.7 C)     Temp Source 12/19/21 1029 Oral     SpO2 12/19/21 1029 93 %     Weight --      Height --      Head Circumference --      Peak Flow --      Pain Score 12/19/21 1028 4     Pain Loc --      Pain Edu? --      Excl. in [place]? --    No data found.  Updated Vital Signs BP 123/78 (BP Location: Left Arm)   Pulse 89   Temp 98.1 F (36.7 C) (Oral)   Resp 18   SpO2 96%   Visual Acuity Right Eye Distance:   Left Eye Distance:   Bilateral Distance:    Right Eye Near:   Left Eye Near:    Bilateral Near:     Physical Exam Constitutional:      General: He is not in acute distress.    Appearance: Normal  appearance. He is not toxic-appearing or diaphoretic.  HENT:     Head: Normocephalic and atraumatic.     Right Ear: Tympanic membrane and ear canal normal.     Left Ear: Tympanic membrane and ear canal normal.     Nose: Congestion present.     Mouth/Throat:     Mouth: Mucous membranes are moist.     Pharynx: No posterior oropharyngeal erythema.  Eyes:     Extraocular Movements: Extraocular movements intact.     Conjunctiva/sclera: Conjunctivae normal.     Pupils: Pupils are equal, round, and reactive to light.  Cardiovascular:     Rate and Rhythm: Normal rate and regular rhythm.     Pulses: Normal pulses.     Heart sounds: Normal heart sounds.  Pulmonary:     Effort: Pulmonary effort is normal. No respiratory distress.     Breath sounds: Normal breath sounds. No stridor. No wheezing, rhonchi or rales.  Abdominal:     General: Abdomen is flat. Bowel sounds are normal.     Palpations: Abdomen is soft.  Musculoskeletal:        General: Normal range of motion.     Cervical back: Normal range of motion.  Skin:    General: Skin is warm and  dry.  Neurological:     General: No focal deficit present.     Mental Status: He is alert and oriented to person, place, and time. Mental status is at baseline.  Psychiatric:        Mood and Affect: Mood normal.        Behavior: Behavior normal.      UC Treatments / Results  Labs (all labs ordered are listed, but only abnormal results are displayed) Labs Reviewed  NOVEL CORONAVIRUS, NAA    EKG   Radiology No results found.  Procedures Procedures (including critical care time)  Medications Ordered in UC Medications - No data to display  Initial Impression / Assessment and Plan / UC Course  I have reviewed the triage vital signs and the nursing notes.  Pertinent labs & imaging results that were available during my care of the patient were reviewed by me and considered in my medical decision making (see chart for details).     Patient presents with symptoms likely from a viral upper respiratory infection. Differential includes bacterial pneumonia, sinusitis, allergic rhinitis, COVID-19, flu. Do not suspect underlying cardiopulmonary process. Symptoms seem unlikely related to ACS, CHF or COPD exacerbations, pneumonia, pneumothorax. Patient is nontoxic appearing and not in need of emergent medical intervention.  COVID test pending.  Recommended symptom control with over the counter medications.  Patient sent prescriptions.  Return if symptoms fail to improve in 1-2 weeks or you develop shortness of breath, chest pain, severe headache. Patient states understanding and is agreeable.  Discharged with PCP followup.  Final Clinical Impressions(s) / UC Diagnoses   Final diagnoses:  Viral upper respiratory tract infection with cough     Discharge Instructions      It appears that you have a viral upper respiratory infection that should self resolve in the next few days with symptomatic treatment.  You have been prescribed 2 medications to help alleviate symptoms.  Please  follow-up if symptoms persist or worsen.  COVID test is pending.  We will call if it is positive.    ED Prescriptions     Medication Sig Dispense Auth. Provider   benzonatate (TESSALON) 100 MG capsule Take 1 capsule (100 mg total) by mouth  every 8 (eight) hours as needed for cough. 21 capsule [place], [place], [place]   fluticasone [facility]) 50 MCG/ACT nasal spray Place 1 spray into both nostrils daily for 3 days. 16 g Gustavus Bryant, [place]      PDMP not reviewed this encounter.   Gustavus Bryant, [place] 12/19/21 1115

## 2021-12-19 NOTE — Discharge Instructions (Signed)
It appears that you have a viral upper respiratory infection that should self resolve in the next few days with symptomatic treatment.  You have been prescribed 2 medications to help alleviate symptoms.  Please follow-up if symptoms persist or worsen.  COVID test is pending.  We will call if it is positive.

## 2021-12-20 LAB — NOVEL CORONAVIRUS, NAA: SARS-CoV-2, NAA: NOT DETECTED

## 2022-06-27 ENCOUNTER — Other Ambulatory Visit: Payer: Self-pay

## 2022-06-27 ENCOUNTER — Emergency Department (HOSPITAL_COMMUNITY): Payer: No Typology Code available for payment source

## 2022-06-27 ENCOUNTER — Encounter (HOSPITAL_COMMUNITY): Payer: Self-pay | Admitting: *Deleted

## 2022-06-27 ENCOUNTER — Inpatient Hospital Stay (HOSPITAL_COMMUNITY)
Admission: EM | Admit: 2022-06-27 | Discharge: 2022-07-02 | DRG: 871 | Disposition: A | Discharge status: Home / Self Care | Payer: No Typology Code available for payment source | Attending: Internal Medicine | Admitting: Internal Medicine

## 2022-06-27 DIAGNOSIS — Z7984 Long term (current) use of oral hypoglycemic drugs: Secondary | ICD-10-CM

## 2022-06-27 DIAGNOSIS — Z6841 Body Mass Index (BMI) 40.0 and over, adult: Secondary | ICD-10-CM

## 2022-06-27 DIAGNOSIS — I5031 Acute diastolic (congestive) heart failure: Secondary | ICD-10-CM | POA: Diagnosis present

## 2022-06-27 DIAGNOSIS — J9601 Acute respiratory failure with hypoxia: Secondary | ICD-10-CM | POA: Diagnosis present

## 2022-06-27 DIAGNOSIS — N1831 Chronic kidney disease, stage 3a: Secondary | ICD-10-CM | POA: Diagnosis present

## 2022-06-27 DIAGNOSIS — Z882 Allergy status to sulfonamides status: Secondary | ICD-10-CM

## 2022-06-27 DIAGNOSIS — Z1152 Encounter for screening for COVID-19: Secondary | ICD-10-CM

## 2022-06-27 DIAGNOSIS — J111 Influenza due to unidentified influenza virus with other respiratory manifestations: Secondary | ICD-10-CM | POA: Diagnosis not present

## 2022-06-27 DIAGNOSIS — A4189 Other specified sepsis: Secondary | ICD-10-CM | POA: Diagnosis not present

## 2022-06-27 DIAGNOSIS — J101 Influenza due to other identified influenza virus with other respiratory manifestations: Secondary | ICD-10-CM | POA: Diagnosis present

## 2022-06-27 DIAGNOSIS — I130 Hypertensive heart and chronic kidney disease with heart failure and stage 1 through stage 4 chronic kidney disease, or unspecified chronic kidney disease: Secondary | ICD-10-CM | POA: Diagnosis present

## 2022-06-27 DIAGNOSIS — Z888 Allergy status to other drugs, medicaments and biological substances status: Secondary | ICD-10-CM

## 2022-06-27 DIAGNOSIS — Z833 Family history of diabetes mellitus: Secondary | ICD-10-CM

## 2022-06-27 DIAGNOSIS — G4733 Obstructive sleep apnea (adult) (pediatric): Secondary | ICD-10-CM | POA: Diagnosis present

## 2022-06-27 DIAGNOSIS — Z79899 Other long term (current) drug therapy: Secondary | ICD-10-CM

## 2022-06-27 DIAGNOSIS — Z8249 Family history of ischemic heart disease and other diseases of the circulatory system: Secondary | ICD-10-CM

## 2022-06-27 DIAGNOSIS — K219 Gastro-esophageal reflux disease without esophagitis: Secondary | ICD-10-CM | POA: Diagnosis present

## 2022-06-27 HISTORY — DX: Disorder of kidney and ureter, unspecified: N28.9

## 2022-06-27 LAB — BRAIN NATRIURETIC PEPTIDE: B Natriuretic Peptide: 13.7 pg/mL (ref 0.0–100.0)

## 2022-06-27 LAB — CBC WITH DIFFERENTIAL/PLATELET
Abs Immature Granulocytes: 0.06 10*3/uL (ref 0.00–0.07)
Basophils Absolute: 0 10*3/uL (ref 0.0–0.1)
Basophils Relative: 0 %
Eosinophils Absolute: 0.1 10*3/uL (ref 0.0–0.5)
Eosinophils Relative: 1 %
HCT: 41 % (ref 39.0–52.0)
Hemoglobin: 13 g/dL (ref 13.0–17.0)
Immature Granulocytes: 1 %
Lymphocytes Relative: 6 %
Lymphs Abs: 0.5 10*3/uL — ABNORMAL LOW (ref 0.7–4.0)
MCH: 27.1 pg (ref 26.0–34.0)
MCHC: 31.7 g/dL (ref 30.0–36.0)
MCV: 85.4 fL (ref 80.0–100.0)
Monocytes Absolute: 0.7 10*3/uL (ref 0.1–1.0)
Monocytes Relative: 9 %
Neutro Abs: 6.4 10*3/uL (ref 1.7–7.7)
Neutrophils Relative %: 83 %
Platelets: 206 10*3/uL (ref 150–400)
RBC: 4.8 MIL/uL (ref 4.22–5.81)
RDW: 13.7 % (ref 11.5–15.5)
WBC: 7.8 10*3/uL (ref 4.0–10.5)
nRBC: 0 % (ref 0.0–0.2)

## 2022-06-27 LAB — COMPREHENSIVE METABOLIC PANEL
ALT: 27 U/L (ref 0–44)
AST: 27 U/L (ref 15–41)
Albumin: 3.7 g/dL (ref 3.5–5.0)
Alkaline Phosphatase: 60 U/L (ref 38–126)
Anion gap: 8 (ref 5–15)
BUN: 18 mg/dL (ref 6–20)
CO2: 23 mmol/L (ref 22–32)
Calcium: 9.1 mg/dL (ref 8.9–10.3)
Chloride: 107 mmol/L (ref 98–111)
Creatinine, Ser: 1.67 mg/dL — ABNORMAL HIGH (ref 0.61–1.24)
GFR, Estimated: 48 mL/min — ABNORMAL LOW (ref >60)
Glucose, Bld: 163 mg/dL — ABNORMAL HIGH (ref 70–99)
Potassium: 3.9 mmol/L (ref 3.5–5.1)
Sodium: 138 mmol/L (ref 135–145)
Total Bilirubin: 0.5 mg/dL (ref 0.3–1.2)
Total Protein: 7.5 g/dL (ref 6.5–8.1)

## 2022-06-27 LAB — RESP PANEL BY RT-PCR (RSV, FLU A&B, COVID)  RVPGX2
Influenza A by PCR: POSITIVE — AB
Influenza B by PCR: NEGATIVE
Resp Syncytial Virus by PCR: NEGATIVE
SARS Coronavirus 2 by RT PCR: NEGATIVE

## 2022-06-27 LAB — LACTIC ACID, PLASMA: Lactic Acid, Venous: 1.3 mmol/L (ref 0.5–1.9)

## 2022-06-27 LAB — TROPONIN I (HIGH SENSITIVITY): Troponin I (High Sensitivity): 15 ng/L (ref <18)

## 2022-06-27 MED ORDER — ACETAMINOPHEN 325 MG PO TABS
650.0000 mg | ORAL_TABLET | Freq: Once | ORAL | Status: AC
  Administered 2022-06-27: 650 mg via ORAL
  Filled 2022-06-27: qty 2

## 2022-06-27 MED ORDER — OSELTAMIVIR PHOSPHATE 75 MG PO CAPS
75.0000 mg | ORAL_CAPSULE | Freq: Once | ORAL | Status: AC
  Administered 2022-06-27: 75 mg via ORAL
  Filled 2022-06-27: qty 1

## 2022-06-27 MED ORDER — DICYCLOMINE HCL 10 MG PO CAPS
10.0000 mg | ORAL_CAPSULE | Freq: Once | ORAL | Status: AC
  Administered 2022-06-27: 10 mg via ORAL
  Filled 2022-06-27: qty 1

## 2022-06-27 NOTE — ED Notes (Signed)
Pt placed on 2L . Pt spo2 dropped to 88 % on room air with increased wob

## 2022-06-27 NOTE — ED Triage Notes (Addendum)
Pt reports cough, back pain, swelling in the legs, shortness of breath, palpitations and fevers. Pt wife has the flu. 2 motrin around 1300 for fever

## 2022-06-27 NOTE — ED Provider Notes (Signed)
[facility]-EMERGENCY DEPT Provider Note   CSN: [id number] Arrival date & time: 06/27/22  1936     History {Add pertinent medical, surgical, social history, OB history to HPI:1} Chief Complaint  Patient presents with   Fever    Marvin Gonzalez is a 56 y.o. male who presents emergency department chief complaint of shortness of breath.  He has a past medical history of acute renal failure after treatment in the past with Bactrim requiring hospitalization and edema.  He states that he has been on Lasix and spironolactone in the past but his doctor retired and he is never been back to just taking over-the-counter water pills when he has peripheral edema.  He has noticed persistent but increasing peripheral edema for about a week.  His wife has the flu and he woke up this morning feeling terrible.  He started having a mild cough yesterday.  Upon arrival here he was noted to be febrile and have high respiratory rate.  Now requiring 2 L of oxygen.  He denies nausea or vomiting.  He denies chest pain.  He  Fever      Home Medications Prior to Admission medications   Medication Sig Start Date End Date Taking? Authorizing Provider  allopurinol (ZYLOPRIM) 300 MG tablet Take 300 mg by mouth daily. 09/20/20   [provider]  benzonatate (TESSALON) 100 MG capsule Take 1 capsule (100 mg total) by mouth every 8 (eight) hours as needed for cough. 12/19/21   Gustavus Bryant, FNP  fluticasone (FLONASE) 50 MCG/ACT nasal spray Place 1 spray into both nostrils daily for 3 days. 12/19/21 12/22/21  Gustavus Bryant, FNP  pantoprazole (PROTONIX) 40 MG tablet Take 40 mg by mouth daily.    [provider]  spironolactone (ALDACTONE) 25 MG tablet Take 25 mg by mouth daily. 11/20/20   [provider]      Allergies    Sulfa antibiotics    Review of Systems   Review of Systems  Constitutional:  Positive for fever.    Physical Exam Updated Vital Signs BP (!) 145/85    Pulse (!) 115   Temp (!) 101.9 F (38.8 C) (Oral)   Resp 18   Ht 5\' 7"  (1.702 m)   Wt 117.9 kg   SpO2 96%   BMI 40.72 kg/m  Physical Exam Vitals and nursing note reviewed.  Constitutional:      General: He is not in acute distress.    Appearance: He is well-developed. He is not diaphoretic.  HENT:     Head: Normocephalic and atraumatic.  Eyes:     General: No scleral icterus.    Conjunctiva/sclera: Conjunctivae normal.  Cardiovascular:     Rate and Rhythm: Normal rate and regular rhythm.     Heart sounds: Normal heart sounds.  Pulmonary:     Breath sounds: Wheezing and rales present.  Abdominal:     Palpations: Abdomen is soft.     Tenderness: There is no abdominal tenderness.  Musculoskeletal:     Cervical back: Normal range of motion and neck supple.     Right lower leg: Edema present.     Left lower leg: Edema present.  Skin:    General: Skin is warm and dry.  Neurological:     Mental Status: He is alert.  Psychiatric:        Behavior: Behavior normal.     ED Results / Procedures / Treatments   Labs (all labs ordered are listed, but only  abnormal results are displayed) Labs Reviewed  CBC WITH DIFFERENTIAL/PLATELET - Abnormal; Notable for the following components:      Result Value   Lymphs Abs 0.5 (*)    All other components within normal limits  RESP PANEL BY RT-PCR (RSV, FLU A&B, COVID)  RVPGX2  BRAIN NATRIURETIC PEPTIDE  LACTIC ACID, PLASMA  LACTIC ACID, PLASMA  COMPREHENSIVE METABOLIC PANEL  TROPONIN I (HIGH SENSITIVITY)    EKG EKG Interpretation  Date/Time:  Sunday June 27 2022 20:27:02 EST Ventricular Rate:  119 PR Interval:  140 QRS Duration: 129 QT Interval:  309 QTC Calculation: 435 R Axis:   82 Text Interpretation: Sinus tachycardia Atrial premature complex Nonspecific intraventricular conduction delay Minimal ST depression, inferior leads Baseline wander TECHNICALLY DIFFICULT Otherwise no significant change Confirmed by Deno Etienne  [id number]) on 06/27/2022 9:13:58 PM  Radiology DG Chest 2 View  Result Date: 06/27/2022 CLINICAL DATA:  Tachycardia EXAM: CHEST - 2 VIEW COMPARISON:  12/27/2008 FINDINGS: The heart size and mediastinal contours are within normal limits. No focal airspace consolidation, pleural effusion, or pneumothorax. The visualized skeletal structures are unremarkable. IMPRESSION: No active cardiopulmonary disease. Electronically Signed   By: Davina Poke D.O.   On: 06/27/2022 20:49    Procedures .Critical Care  Performed by: Margarita Mail, PA-C Authorized by: Margarita Mail, PA-C   Critical care provider statement:    Critical care time (minutes):  30   Critical care time was exclusive of:  Separately billable procedures and treating other patients   Critical care was necessary to treat or prevent imminent or life-threatening deterioration of the following conditions:  Respiratory failure   Critical care was time spent personally by me on the following activities:  Development of treatment plan with patient or surrogate, discussions with consultants, evaluation of patient's response to treatment, examination of patient, ordering and review of laboratory studies, ordering and review of radiographic studies, ordering and performing treatments and interventions, pulse oximetry, re-evaluation of patient's condition and review of old charts   {Document cardiac monitor, telemetry assessment procedure when appropriate:1}  Medications Ordered in ED Medications  dicyclomine (BENTYL) capsule 10 mg (has no administration in time range)  acetaminophen (TYLENOL) tablet 650 mg (650 mg Oral Given 06/27/22 2049)    ED Course/ Medical Decision Making/ A&P                           Medical Decision Making 26-year-old male here with fever, shortness of breath, flu positive.  He is requiring oxygen supplementation.  Signout given to PA Regent at shift change.  Patient will need admission.  Amount and/or  Complexity of Data Reviewed Labs: ordered.  Risk Prescription drug management.   ***  {Document critical care time when appropriate:1} {Document review of labs and clinical decision tools ie heart score, Chads2Vasc2 etc:1}  {Document your independent review of radiology images, and any outside records:1} {Document your discussion with family members, caretakers, and with consultants:1} {Document social determinants of health affecting pt's care:1} {Document your decision making why or why not admission, treatments were needed:1} Final Clinical Impression(s) / ED Diagnoses Final diagnoses:  None    Rx / DC Orders ED Discharge Orders     None

## 2022-06-27 NOTE — ED Provider Triage Note (Signed)
Emergency Medicine Provider Triage Evaluation Note  Marvin Gonzalez , a 56 y.o. male  was evaluated in triage.  Pt complains of cough, sob, fever, palpitations, and back pain that started yesterday. Wife positive for flu. No abd pain, nausea, vomiting, dysuria, hematuria, bowel or bladder dysfunction, focal weakness, or paresthesias. Pain to left lower back, no midline pain, no trauma or injury. + LE edema, worse on the right. No history of DVT/PE. No chest pain. Reports on no chronic medications, has not seen PCP in over a year.   Review of Systems  Positive: See HPI Negative: See HPI  Physical Exam  BP (!) 191/100 (BP Location: Left Arm)   Pulse (!) 125   Temp (!) 101.9 F (38.8 C) (Oral)   Resp (!) 22   Ht 5\' 7"  (1.702 m)   Wt 117.9 kg   SpO2 95%   BMI 40.72 kg/m  Gen:   Awake, no distress   Resp:  Tachypneic, LCTA MSK:   Moves extremities without difficulty , 2+ RLE edema, 1+ LLE edema, no calf tenderness bilaterally Other:  Tachycardia with regular rhythm, slightly diaphoretic  Medical Decision Making  Medically screening exam initiated at 8:26 PM.  Appropriate orders placed.  Marvin Gonzalez was informed that the remainder of the evaluation will be completed by another provider, this initial triage assessment does not replace that evaluation, and the importance of remaining in the ED until their evaluation is complete.     Sheran Luz, PA-C 06/27/22 2030

## 2022-06-27 NOTE — ED Provider Notes (Signed)
Received at shift change from Margarita Mail, PA-C please see note for full detail  In short patient with medical history including sleep apnea, GERD, peripheral edema presents with complaints of not feeling well, started yesterday, endorses fever cough congestion, states he feels slight short of breath denies any pleuritic chest pain chest pain itself, no general body aches, no nausea vomiting no constipation or diarrhea, states that his wife is sick with influenza and feels he might have it, he is not immunocompromise, he did not get his influenza vaccine.  No history of PEs or DVTs: On hormone therapy no recent surgeries or long immobilizations.  Per previous provider admit to medicine for acute respiratory failure secondary due to influenza Physical Exam  BP 109/76   Pulse 93   Temp 99.3 F (37.4 C) (Axillary)   Resp (!) 24   Ht 5\' 7"  (1.702 m)   Wt 117.9 kg   SpO2 98%   BMI 40.72 kg/m   Physical Exam Vitals and nursing note reviewed.  Constitutional:      General: He is not in acute distress.    Appearance: He is not ill-appearing.  HENT:     Head: Normocephalic and atraumatic.     Nose: No congestion.  Eyes:     Conjunctiva/sclera: Conjunctivae normal.  Cardiovascular:     Rate and Rhythm: Normal rate and regular rhythm.     Pulses: Normal pulses.     Heart sounds: No murmur heard.    No friction rub. No gallop.  Pulmonary:     Effort: No respiratory distress.     Breath sounds: No wheezing, rhonchi or rales.     Comments: Patient is slightly tachypneic on my exam but is able to speak in full sentences, lung sounds are clear, slight bibasilar Rales heard, no wheezing or rhonchi present.  Currently on 2 L via nasal cannula remains in the mid 90s. Musculoskeletal:     Right lower leg: Edema present.     Left lower leg: Edema present.     Comments: Patient has nonpitting 1+ edema up to the knees bilaterally no overlying skin changes, 2+ dorsal pedal pulses.  Skin:     General: Skin is warm and dry.  Neurological:     Mental Status: He is alert.  Psychiatric:        Mood and Affect: Mood normal.     Procedures  .Critical Care  Performed by: Marcello Fennel, PA-C Authorized by: Marcello Fennel, PA-C   Critical care provider statement:    Critical care time (minutes):  30   Critical care time was exclusive of:  Separately billable procedures and treating other patients   Critical care was necessary to treat or prevent imminent or life-threatening deterioration of the following conditions:  Respiratory failure   Critical care was time spent personally by me on the following activities:  Development of treatment plan with patient or surrogate, discussions with consultants, evaluation of patient's response to treatment, examination of patient, ordering and review of laboratory studies, ordering and review of radiographic studies, ordering and performing treatments and interventions, pulse oximetry, re-evaluation of patient's condition and review of old charts   I assumed direction of critical care for this patient from another provider in my specialty: no     Care discussed with: admitting provider     ED Course / MDM    Medical Decision Making Amount and/or Complexity of Data Reviewed Labs: ordered.  Risk Prescription drug management. Decision regarding hospitalization.  Lab Tests:  I Ordered, and personally interpreted labs.  The pertinent results include: CBC is unremarkable, CMP shows glucose of 163, creatinine 1.67 GFR 48, lactic 1.3, first troponin is 15, second troponin is 16, lactic 1.2, VBG unremarkable BNP is 37, respiratory panel positive for COVID   Imaging Studies ordered:  I ordered imaging studies including chest x-ray I independently visualized and interpreted imaging which showed unremarkable I agree with the radiologist interpretation   Cardiac Monitoring:  The patient was maintained on a cardiac monitor.  I  personally viewed and interpreted the cardiac monitored which showed an underlying rhythm of: Sinus tach without signs of ischemia   Medicines ordered and prescription drug management:  I ordered medication including Tamiflu I have reviewed the patients home medicines and have made adjustments as needed  Critical Interventions:  My examination he was tachypnea, concern for respiratory failure at the rate that he is going on, will add on BiPAP and obtain VBG. Reassessed the patient after BiPAP, appears more comfortable at this time, tachypnea has improved new pressure is 24, O2 sat remain stable in the upper 90s.   Reevaluation:  Presents with cough congestion, my exam is consistent with influenza, will provide with Tamiflu, I recommend admission for further observation he is agreement this plan.    Consultations Obtained:  I requested consultation with the hospitalist Dr. Maisie Fus,  and discussed lab and imaging findings as well as pertinent plan - they recommend: She will admit the patient.    Test Considered:  CTA chest-will be deferred my suspicion for PE is low at this time denies pleuritic chest pain, presentation is more consistent with likely influenza as his wife has influenza, he tested positive for influenza, there is no unilateral leg swelling no recent surgeries no calf tenderness no palpable cords.    Rule out I have low suspicion for ACS as history is atypical, patient has no cardiac history, EKG without signs of ischemia, patient had negative troponin.  Low suspicion for AAA or aortic dissection as history is atypical, patient has low risk factors.  Low suspicion for systemic infection as patient is nontoxic-appearing, vital signs reassuring, no obvious source infection noted on exam.     Dispostion and problem list  After consideration of the diagnostic results and the patients response to treatment, I feel that the patent would benefit from  admission.  Respiratory failure-secondary due to influenza, patient was given Tamiflu, patient will need current monitoring, will defer on antibiotics as he has no leukocytosis, chest x-ray is negative for evidence of infection.           Carroll Sage, PA-C 06/28/22 0041    Paula Libra, MD 06/28/22 [id number]

## 2022-06-28 ENCOUNTER — Encounter (HOSPITAL_COMMUNITY): Payer: Self-pay | Admitting: Internal Medicine

## 2022-06-28 DIAGNOSIS — N1831 Chronic kidney disease, stage 3a: Secondary | ICD-10-CM

## 2022-06-28 DIAGNOSIS — K219 Gastro-esophageal reflux disease without esophagitis: Secondary | ICD-10-CM

## 2022-06-28 DIAGNOSIS — Z79899 Other long term (current) drug therapy: Secondary | ICD-10-CM | POA: Diagnosis not present

## 2022-06-28 DIAGNOSIS — J9601 Acute respiratory failure with hypoxia: Secondary | ICD-10-CM

## 2022-06-28 DIAGNOSIS — Z833 Family history of diabetes mellitus: Secondary | ICD-10-CM | POA: Diagnosis not present

## 2022-06-28 DIAGNOSIS — Z882 Allergy status to sulfonamides status: Secondary | ICD-10-CM | POA: Diagnosis not present

## 2022-06-28 DIAGNOSIS — M7989 Other specified soft tissue disorders: Secondary | ICD-10-CM | POA: Diagnosis not present

## 2022-06-28 DIAGNOSIS — I10 Essential (primary) hypertension: Secondary | ICD-10-CM | POA: Diagnosis not present

## 2022-06-28 DIAGNOSIS — I130 Hypertensive heart and chronic kidney disease with heart failure and stage 1 through stage 4 chronic kidney disease, or unspecified chronic kidney disease: Secondary | ICD-10-CM | POA: Diagnosis present

## 2022-06-28 DIAGNOSIS — Z6841 Body Mass Index (BMI) 40.0 and over, adult: Secondary | ICD-10-CM | POA: Diagnosis not present

## 2022-06-28 DIAGNOSIS — J101 Influenza due to other identified influenza virus with other respiratory manifestations: Secondary | ICD-10-CM | POA: Diagnosis present

## 2022-06-28 DIAGNOSIS — G4733 Obstructive sleep apnea (adult) (pediatric): Secondary | ICD-10-CM

## 2022-06-28 DIAGNOSIS — Z1152 Encounter for screening for COVID-19: Secondary | ICD-10-CM | POA: Diagnosis not present

## 2022-06-28 DIAGNOSIS — J111 Influenza due to unidentified influenza virus with other respiratory manifestations: Secondary | ICD-10-CM | POA: Diagnosis not present

## 2022-06-28 DIAGNOSIS — Z8249 Family history of ischemic heart disease and other diseases of the circulatory system: Secondary | ICD-10-CM | POA: Diagnosis not present

## 2022-06-28 DIAGNOSIS — R0602 Shortness of breath: Secondary | ICD-10-CM | POA: Diagnosis not present

## 2022-06-28 DIAGNOSIS — Z888 Allergy status to other drugs, medicaments and biological substances status: Secondary | ICD-10-CM | POA: Diagnosis not present

## 2022-06-28 DIAGNOSIS — Z7984 Long term (current) use of oral hypoglycemic drugs: Secondary | ICD-10-CM | POA: Diagnosis not present

## 2022-06-28 DIAGNOSIS — A4189 Other specified sepsis: Secondary | ICD-10-CM | POA: Diagnosis present

## 2022-06-28 DIAGNOSIS — I5031 Acute diastolic (congestive) heart failure: Secondary | ICD-10-CM | POA: Diagnosis present

## 2022-06-28 LAB — BLOOD GAS, VENOUS
Acid-Base Excess: 3.2 mmol/L — ABNORMAL HIGH (ref 0.0–2.0)
Bicarbonate: 29.5 mmol/L — ABNORMAL HIGH (ref 20.0–28.0)
O2 Saturation: 79.3 %
Patient temperature: 37.1
pCO2, Ven: 51 mmHg (ref 44–60)
pH, Ven: 7.37 (ref 7.25–7.43)
pO2, Ven: 52 mmHg — ABNORMAL HIGH (ref 32–45)

## 2022-06-28 LAB — COMPREHENSIVE METABOLIC PANEL
ALT: 29 U/L (ref 0–44)
AST: 32 U/L (ref 15–41)
Albumin: 3.4 g/dL — ABNORMAL LOW (ref 3.5–5.0)
Alkaline Phosphatase: 52 U/L (ref 38–126)
Anion gap: 6 (ref 5–15)
BUN: 18 mg/dL (ref 6–20)
CO2: 26 mmol/L (ref 22–32)
Calcium: 8.4 mg/dL — ABNORMAL LOW (ref 8.9–10.3)
Chloride: 103 mmol/L (ref 98–111)
Creatinine, Ser: 1.77 mg/dL — ABNORMAL HIGH (ref 0.61–1.24)
GFR, Estimated: 45 mL/min — ABNORMAL LOW (ref >60)
Glucose, Bld: 147 mg/dL — ABNORMAL HIGH (ref 70–99)
Potassium: 4.4 mmol/L (ref 3.5–5.1)
Sodium: 135 mmol/L (ref 135–145)
Total Bilirubin: 0.6 mg/dL (ref 0.3–1.2)
Total Protein: 7.4 g/dL (ref 6.5–8.1)

## 2022-06-28 LAB — CBC
HCT: 41.5 % (ref 39.0–52.0)
Hemoglobin: 12.8 g/dL — ABNORMAL LOW (ref 13.0–17.0)
MCH: 27 pg (ref 26.0–34.0)
MCHC: 30.8 g/dL (ref 30.0–36.0)
MCV: 87.6 fL (ref 80.0–100.0)
Platelets: 193 10*3/uL (ref 150–400)
RBC: 4.74 MIL/uL (ref 4.22–5.81)
RDW: 14 % (ref 11.5–15.5)
WBC: 6.8 10*3/uL (ref 4.0–10.5)
nRBC: 0 % (ref 0.0–0.2)

## 2022-06-28 LAB — TROPONIN I (HIGH SENSITIVITY): Troponin I (High Sensitivity): 16 ng/L (ref <18)

## 2022-06-28 LAB — HIV ANTIBODY (ROUTINE TESTING W REFLEX): HIV Screen 4th Generation wRfx: NONREACTIVE

## 2022-06-28 LAB — LACTIC ACID, PLASMA: Lactic Acid, Venous: 1.2 mmol/L (ref 0.5–1.9)

## 2022-06-28 MED ORDER — HEPARIN SODIUM (PORCINE) 5000 UNIT/ML IJ SOLN
5000.0000 [IU] | Freq: Three times a day (TID) | INTRAMUSCULAR | Status: DC
  Administered 2022-06-28 – 2022-07-02 (×12): 5000 [IU] via SUBCUTANEOUS
  Filled 2022-06-28 (×13): qty 1

## 2022-06-28 MED ORDER — IBUPROFEN 200 MG PO TABS
400.0000 mg | ORAL_TABLET | Freq: Once | ORAL | Status: AC
  Administered 2022-06-28: 400 mg via ORAL
  Filled 2022-06-28: qty 2

## 2022-06-28 MED ORDER — AZITHROMYCIN 250 MG PO TABS
500.0000 mg | ORAL_TABLET | Freq: Every day | ORAL | Status: AC
  Administered 2022-06-28 – 2022-06-30 (×3): 500 mg via ORAL
  Filled 2022-06-28 (×3): qty 2

## 2022-06-28 MED ORDER — ACETAMINOPHEN 325 MG PO TABS
650.0000 mg | ORAL_TABLET | Freq: Four times a day (QID) | ORAL | Status: DC | PRN
  Administered 2022-06-28 – 2022-07-01 (×7): 650 mg via ORAL
  Filled 2022-06-28 (×7): qty 2

## 2022-06-28 MED ORDER — OSELTAMIVIR PHOSPHATE 75 MG PO CAPS
75.0000 mg | ORAL_CAPSULE | Freq: Two times a day (BID) | ORAL | Status: DC
  Administered 2022-06-28 – 2022-07-02 (×9): 75 mg via ORAL
  Filled 2022-06-28 (×9): qty 1

## 2022-06-28 MED ORDER — ALBUTEROL SULFATE (2.5 MG/3ML) 0.083% IN NEBU: 2.5000 mg | INHALATION_SOLUTION | RESPIRATORY_TRACT | Status: DC | PRN

## 2022-06-28 MED ORDER — ACETAMINOPHEN 650 MG RE SUPP: 650.0000 mg | Freq: Four times a day (QID) | RECTAL | Status: DC | PRN

## 2022-06-28 MED ORDER — SPIRONOLACTONE 25 MG PO TABS: 25.0000 mg | ORAL_TABLET | Freq: Every day | ORAL | Status: DC

## 2022-06-28 MED ORDER — IPRATROPIUM-ALBUTEROL 0.5-2.5 (3) MG/3ML IN SOLN: 3.0000 mL | Freq: Four times a day (QID) | RESPIRATORY_TRACT | Status: DC | PRN

## 2022-06-28 MED ORDER — BENZONATATE 100 MG PO CAPS: 100.0000 mg | ORAL_CAPSULE | Freq: Three times a day (TID) | ORAL | Status: DC | PRN

## 2022-06-28 MED ORDER — PANTOPRAZOLE SODIUM 40 MG PO TBEC
40.0000 mg | DELAYED_RELEASE_TABLET | Freq: Every day | ORAL | Status: DC
  Administered 2022-06-28 – 2022-07-02 (×5): 40 mg via ORAL
  Filled 2022-06-28 (×5): qty 1

## 2022-06-28 MED ORDER — ONDANSETRON HCL 4 MG PO TABS: 4.0000 mg | ORAL_TABLET | Freq: Four times a day (QID) | ORAL | Status: DC | PRN

## 2022-06-28 MED ORDER — ONDANSETRON HCL 4 MG/2ML IJ SOLN: 4.0000 mg | Freq: Four times a day (QID) | INTRAMUSCULAR | Status: DC | PRN

## 2022-06-28 NOTE — Progress Notes (Signed)
Patient declines BiPAP at this time. He states that he will call for someone to assist him when he is ready to get in the bed. At this time, he remains on nasal O2, OOB, in the chair with stable VS.

## 2022-06-28 NOTE — Progress Notes (Signed)
Patient wore Bi-Pap from 1130 to 1615, reports "feeling better" than before.  Bradd Burner, RN

## 2022-06-28 NOTE — Progress Notes (Signed)
11:35 Pt placed on BIPAP for sleep, high RR. Tolerating well at this time.Marland Kitchen

## 2022-06-28 NOTE — Progress Notes (Signed)
Pt transferred from ED on bipap with RN to 4W [id number] without any complications.

## 2022-06-28 NOTE — Plan of Care (Signed)
Patient A/o x4. Continues on Bipap. Vitals stable. No needs expressed at this time.  Problem: Education: Goal: Knowledge of General Education information will improve Description: Including pain rating scale, medication(s)/side effects and non-pharmacologic comfort measures Outcome: Progressing   Problem: Health Behavior/Discharge Planning: Goal: Ability to manage health-related needs will improve Outcome: Progressing

## 2022-06-28 NOTE — Progress Notes (Signed)
   06/28/22 0928  Assess: MEWS Score  Temp (!) 101.2 F (38.4 C)  BP (!) 145/67  MAP (mmHg) 81  Pulse Rate (!) 107  Resp (!) 24  Level of Consciousness Alert  SpO2 94 %  O2 Device Nasal Cannula  O2 Flow Rate (L/min) 2 L/min  Assess: MEWS Score  MEWS Temp 1  MEWS Systolic 0  MEWS Pulse 1  MEWS RR 1  MEWS LOC 0  MEWS Score 3  MEWS Score Color Yellow  Assess: if the MEWS score is Yellow or Red  Were vital signs taken at a resting state? Yes  Focused Assessment Change from prior assessment (see assessment flowsheet)  Does the patient meet 2 or more of the SIRS criteria? Yes  Does the patient have a confirmed or suspected source of infection? Yes  Provider and Rapid Response Notified?  (provider notified)  MEWS guidelines implemented *See Row Information* Yes  Treat  Pain Scale 0-10  Pain Score 3  Pain Type Acute pain  Pain Location Head  Pain Descriptors / Indicators Headache  Take Vital Signs  Increase Vital Sign Frequency  Yellow: Q 2hr X 2 then Q 4hr X 2, if remains yellow, continue Q 4hrs  Escalate  MEWS: Escalate Yellow: discuss with charge nurse/RN and consider discussing with provider and RRT  Notify: Charge Nurse/RN  Name of Charge Nurse/RN Notified Kathrynn Humble, RN  Date Charge Nurse/RN Notified 06/28/22  Time Charge Nurse/RN Notified [id number]  Provider Notification  Provider Name/Title Kathlen Mody, MD  Date Provider Notified 06/28/22  Time Provider Notified [phone]  Method of Notification  (secure chat)  Notification Reason Change in status  Assess: SIRS CRITERIA  SIRS Temperature  1  SIRS Pulse 1  SIRS Respirations  1  SIRS WBC 0  SIRS Score Sum  3

## 2022-06-28 NOTE — Progress Notes (Signed)
   06/28/22 1123  Assess: MEWS Score  Temp (!) 102.8 F (39.3 C)  BP 139/80  MAP (mmHg) 98  Pulse Rate (!) 106  Resp (!) 54  Level of Consciousness Alert  SpO2 95 %  O2 Device Nasal Cannula  O2 Flow Rate (L/min) 4 L/min  Assess: MEWS Score  MEWS Temp 2  MEWS Systolic 0  MEWS Pulse 1  MEWS RR 3  MEWS LOC 0  MEWS Score 6  MEWS Score Color Red  Assess: if the MEWS score is Yellow or Red  Were vital signs taken at a resting state? Yes  Focused Assessment Change from prior assessment (see assessment flowsheet)  Does the patient meet 2 or more of the SIRS criteria? Yes  Does the patient have a confirmed or suspected source of infection? Yes  Provider and Rapid Response Notified?  (provider notified)  MEWS guidelines implemented *See Row Information* Yes  Treat  MEWS Interventions Escalated (See documentation below);Administered prn meds/treatments  Complains of Fever  Interventions Medication (see MAR)  Take Vital Signs  Increase Vital Sign Frequency  Red: Q 1hr X 4 then Q 4hr X 4, if remains red, continue Q 4hrs  Escalate  MEWS: Escalate Red: discuss with charge nurse/RN and provider, consider discussing with RRT  Notify: Charge Nurse/RN  Name of Charge Nurse/RN Notified Kathrynn Humble, RN  Date Charge Nurse/RN Notified 06/28/22  Time Charge Nurse/RN Notified 1130  Provider Notification  Provider Name/Title Kathlen Mody, MD  Date Provider Notified 06/28/22  Time Provider Notified 1130  Method of Notification Page  Notification Reason Change in status  Provider response At bedside  Date of Provider Response 06/28/22  Time of Provider Response 1131  Assess: SIRS CRITERIA  SIRS Temperature  1  SIRS Pulse 1  SIRS Respirations  1  SIRS WBC 0  SIRS Score Sum  3

## 2022-06-28 NOTE — Progress Notes (Signed)
[facility]                                                                               Marvin Gonzalez, is a 56 y.o. male, DOB - 03-10-1966, BTD:[id number] Admit date - 06/27/2022    Outpatient Primary MD for the patient is [facility] [place] @ [place]  LOS - 0  days    Brief summary     Marvin Gonzalez is a 56 y.o. male with medical history significant of  GERD, obesity, OSA on CPAP, CKD who present to ED with complaint of cough , congestion fever and shortness of breath x 1 day. Patient also noted his wife has the flu.   Assessment & Plan    Assessment and Plan:    Sepsis from Influenza A  Acute respiratory failure with hypoxia secondary to influenza A.  Pt tachycardic, tachypnea, hypertensive, hypoxia requiring between 3 to 4 lit of [place] oxygen.  Influenza A positive.  COVID PCR is negative.  CXR is negative for pneumonia.  Continue with tamiflu 75 mg BID.  [place] oxygen to keep sats greater than 90%. Currently on 3 lit of [place] oxygen.  BIPAP as needed.  Flutter valve and incentive spirometry.  Continue with duo nebs and cough medications.    GERD;  Continue with PPI.    Hypertension:  BP parameters ae well controlled.    OSA:  On CPAP.    CKD:  Stage 3a CKD.    Body mass index is 47.77 kg/m. Morbid Obesity Recommend outpatient follow up with PCP .   Bilateral lower extremity edema;  Venous duplex of the lower extremity ordered to rule out DVT.      Estimated body mass index is 47.77 kg/m as calculated from the following:   Height as of this encounter: _0  (1.702 m).   Weight as of this encounter: 138.3 kg.  Code Status: full code.  DVT Prophylaxis:  heparin injection 5,000 Units Start: 06/28/22 0600   Level of Care: Level of care: Progressive Family Communication: none at bedside.   Disposition Plan:     Remains inpatient appropriate: acute respiratory failure with hypoxia.   Procedures:  None.   Consultants:    None.   Antimicrobials:   Anti-infectives (From admission, onward)    Start     Dose/Rate Route Frequency Ordered Stop   06/28/22 1030  oseltamivir (TAMIFLU) capsule 75 mg        75 mg Oral 2 times daily 06/28/22 0933 07/03/22 0959   06/27/22 2215  oseltamivir (TAMIFLU) capsule 75 mg        75 mg Oral  Once 06/27/22 2213 06/27/22 2310        Medications  Scheduled Meds:  heparin  5,000 Units Subcutaneous Q8H   oseltamivir  75 mg Oral BID   pantoprazole  40 mg Oral Daily   Continuous Infusions: PRN Meds:.acetaminophen **OR** acetaminophen, albuterol, ondansetron **OR** ondansetron (ZOFRAN) IV    Subjective:   Marvin Gonzalez was seen and examined today.  No cough, still sob. Febrile.   Objective:   Vitals:   06/28/22 1118 06/28/22 1123 06/28/22 1135 06/28/22 1234  BP: 139/80 139/80  113/64  Pulse:  (!) 106  91  Resp:  (!) 54  (!) 29  Temp:  (!) 102.8 F (39.3 C)  99.9 F (37.7 C)  TempSrc:  Oral  Oral  SpO2: 94% 95% 100% 97%  Weight:      Height:        Intake/Output Summary (Last 24 hours) at 06/28/2022 1342 Last data filed at 06/28/2022 1000 Gross per 24 hour  Intake 200 ml  Output --  Net 200 ml   Filed Weights   06/27/22 2015 06/28/22 0500  Weight: 117.9 kg (!) 138.3 kg     Exam General exam: ill appearing gentleman, tachypnic.  Respiratory system: diminished air entry at bases. On 3 lit of [place] oxygen.  Cardiovascular system: S1 & S2 heard, RRR. No JVD, Gastrointestinal system: Abdomen is nondistended, soft and nontender.  Central nervous system: Alert and oriented. No focal neurological deficits. Extremities: Symmetric 5 x 5 power. Skin: No rashes,  Psychiatry:  Mood & affect appropriate.     Data Reviewed:  I have personally reviewed following labs and imaging studies   CBC Lab Results  Component Value Date   WBC 6.8 06/28/2022   RBC 4.74 06/28/2022   HGB 12.8 (L) 06/28/2022   HCT 41.5 06/28/2022   MCV 87.6 06/28/2022   MCH 27.0  06/28/2022   PLT 193 06/28/2022   MCHC 30.8 06/28/2022   RDW 14.0 06/28/2022   LYMPHSABS 0.5 (L) 06/27/2022   MONOABS 0.7 06/27/2022   EOSABS 0.1 06/27/2022   BASOSABS 0.0 24/82/5003     Last metabolic panel Lab Results  Component Value Date   NA 135 06/28/2022   K 4.4 06/28/2022   CL 103 06/28/2022   CO2 26 06/28/2022   BUN 18 06/28/2022   CREATININE 1.77 (H) 06/28/2022   GLUCOSE 147 (H) 06/28/2022   GFRNONAA 45 (L) 06/28/2022   GFRAA  12/27/2008    >60        The eGFR has been calculated using the MDRD equation. This calculation has not been validated in all clinical situations. eGFR's persistently <60 mL/min signify possible Chronic Kidney Disease.   CALCIUM 8.4 (L) 06/28/2022   PROT 7.4 06/28/2022   ALBUMIN 3.4 (L) 06/28/2022   BILITOT 0.6 06/28/2022   ALKPHOS 52 06/28/2022   AST 32 06/28/2022   ALT 29 06/28/2022   ANIONGAP 6 06/28/2022    CBG (last 3)  No results for input(s): "GLUCAP" in the last 72 hours.    Coagulation Profile: No results for input(s): "INR", "PROTIME" in the last 168 hours.   Radiology Studies: DG Chest 2 View  Result Date: 06/27/2022 CLINICAL DATA:  Tachycardia EXAM: CHEST - 2 VIEW COMPARISON:  12/27/2008 FINDINGS: The heart size and mediastinal contours are within normal limits. No focal airspace consolidation, pleural effusion, or pneumothorax. The visualized skeletal structures are unremarkable. IMPRESSION: No active cardiopulmonary disease. Electronically Signed   By: Davina Poke D.O.   On: 06/27/2022 20:49       Hosie Poisson M.D. [facility] 06/28/2022, 1:42 PM  Available via Epic secure chat 7am-7pm After 7 pm, please refer to night coverage provider listed on amion.

## 2022-06-28 NOTE — H&P (Incomplete)
History and Physical    Marvin Gonzalez JJH:[id number] DOB: January 29, 1966 DOA: 06/27/2022  PCP: [facility] [place]  Patient coming from:   I have personally briefly reviewed patient's old medical records in [facility]  Chief Complaint: sob, congestion , cough ,fever  HPI: Marvin Gonzalez is a 56 y.o. male with medical history significant of  GERD, obesity, OSA on CPAP, CKD who present to ED with complaint of cough , congestion fever and shortness of breath x 1 day. Patient also noted his wife has the flu.    ED Course:  Tmx 101.9, bp 191/100, hr 125, rr 22 sat 95% on ra  ED course complicated by desat to 86% and increase rr 34 Sinus tachycardia, pac , IVCD no change from prior Labs: Wbc 7.8, hgb 13, plt 206  Lactic 1.3  CE 15,16 Cxr:NAD NA 138, K 3.9, CL 107, glu 163 cr 1.67 (1.28) Tx :bentyl,tamifly Review of Systems: As per HPI otherwise 10 point review of systems negative.   Past Medical History:  Diagnosis Date   GERD (gastroesophageal reflux disease)    Obesity    OSA on CPAP    Renal disorder     Past Surgical History:  Procedure Laterality Date   TONSILLECTOMY AND ADENOIDECTOMY  1996     reports that he has never smoked. He has never used smokeless tobacco. No history on file for alcohol use and drug use.  Allergies  Allergen Reactions   Sulfa Antibiotics Rash    Family History  Problem Relation Age of Onset   Diabetes Mellitus I Father    Hypertension Father    Coronary artery disease Father    Diabetes Mellitus I Sister    Hypertension Sister     Prior to Admission medications   Medication Sig Start Date End Date Taking? Authorizing Provider  allopurinol (ZYLOPRIM) 300 MG tablet Take 300 mg by mouth daily. 09/20/20   [provider]  benzonatate (TESSALON) 100 MG capsule Take 1 capsule (100 mg total) by mouth every 8 (eight) hours as needed for cough. 12/19/21   Gustavus Bryant, FNP  fluticasone (FLONASE) 50 MCG/ACT  nasal spray Place 1 spray into both nostrils daily for 3 days. 12/19/21 12/22/21  Gustavus Bryant, FNP  pantoprazole (PROTONIX) 40 MG tablet Take 40 mg by mouth daily.    [provider]  spironolactone (ALDACTONE) 25 MG tablet Take 25 mg by mouth daily. 11/20/20   [provider]    Physical Exam: Vitals:   06/27/22 2145 06/27/22 2215 06/27/22 2310 06/27/22 2330  BP: 109/67 109/62 101/64 (!) 123/48  Pulse: (!) 111 87 96 97  Resp: 16 (!) 34 (!) 30 (!) 29  Temp:      TempSrc:      SpO2: 95% (!) 86% 97% 100%  Weight:      Height:        Constitutional: NAD, calm, comfortable Vitals:   06/27/22 2145 06/27/22 2215 06/27/22 2310 06/27/22 2330  BP: 109/67 109/62 101/64 (!) 123/48  Pulse: (!) 111 87 96 97  Resp: 16 (!) 34 (!) 30 (!) 29  Temp:      TempSrc:      SpO2: 95% (!) 86% 97% 100%  Weight:      Height:       Eyes: PERRL, lids and conjunctivae normal ENMT: Mucous membranes are moist. Posterior pharynx clear of any exudate or lesions.Normal dentition.  Neck: normal, supple, no masses, no thyromegaly Respiratory: clear to  auscultation bilaterally, no wheezing, no crackles. Normal respiratory effort. No accessory muscle use.  Cardiovascular: Regular rate and rhythm, no murmurs / rubs / gallops. No extremity edema. 2+ pedal pulses. No carotid bruits.  Abdomen: no tenderness, no masses palpated. No hepatosplenomegaly. Bowel sounds positive.  Musculoskeletal: no clubbing / cyanosis. No joint deformity upper and lower extremities. Good ROM, no contractures. Normal muscle tone.  Skin: no rashes, lesions, ulcers. No induration Neurologic: CN 2-12 grossly intact. Sensation intact, DTR normal. Strength 5/5 in all 4.  Psychiatric: Normal judgment and insight. Alert and oriented x 3. Normal mood.    Labs on Admission: I have personally reviewed following labs and imaging studies  CBC: Recent Labs  Lab 06/27/22 2056  WBC 7.8  NEUTROABS 6.4  HGB 13.0  HCT 41.0  MCV  85.4  PLT 206   Basic Metabolic Panel: Recent Labs  Lab 06/27/22 2056  NA 138  K 3.9  CL 107  CO2 23  GLUCOSE 163*  BUN 18  CREATININE 1.67*  CALCIUM 9.1   GFR: Estimated Creatinine Clearance: 61.4 mL/min (A) (by C-G formula based on SCr of 1.67 mg/dL (H)). Liver Function Tests: Recent Labs  Lab 06/27/22 2056  AST 27  ALT 27  ALKPHOS 60  BILITOT 0.5  PROT 7.5  ALBUMIN 3.7   No results for input(s): "LIPASE", "AMYLASE" in the last 168 hours. No results for input(s): "AMMONIA" in the last 168 hours. Coagulation Profile: No results for input(s): "INR", "PROTIME" in the last 168 hours. Cardiac Enzymes: No results for input(s): "CKTOTAL", "CKMB", "CKMBINDEX", "TROPONINI" in the last 168 hours. BNP (last 3 results) No results for input(s): "PROBNP" in the last 8760 hours. HbA1C: No results for input(s): "HGBA1C" in the last 72 hours. CBG: No results for input(s): "GLUCAP" in the last 168 hours. Lipid Profile: No results for input(s): "CHOL", "HDL", "LDLCALC", "TRIG", "CHOLHDL", "LDLDIRECT" in the last 72 hours. Thyroid Function Tests: No results for input(s): "TSH", "T4TOTAL", "FREET4", "T3FREE", "THYROIDAB" in the last 72 hours. Anemia Panel: No results for input(s): "VITAMINB12", "FOLATE", "FERRITIN", "TIBC", "IRON", "RETICCTPCT" in the last 72 hours. Urine analysis:    Component Value Date/Time   COLORURINE YELLOW 12/27/2008 1119   APPEARANCEUR CLEAR 12/27/2008 1119   LABSPEC 1.021 12/27/2008 1119   PHURINE 6.0 12/27/2008 1119   GLUCOSEU 250 (A) 12/27/2008 1119   HGBUR NEGATIVE 12/27/2008 1119   BILIRUBINUR NEGATIVE 12/27/2008 1119   KETONESUR NEGATIVE 12/27/2008 1119   PROTEINUR NEGATIVE 12/27/2008 1119   UROBILINOGEN 1.0 12/27/2008 1119   NITRITE NEGATIVE 12/27/2008 1119   LEUKOCYTESUR  12/27/2008 1119    NEGATIVE MICROSCOPIC NOT DONE ON URINES WITH NEGATIVE PROTEIN, BLOOD, LEUKOCYTES, NITRITE, OR GLUCOSE <1000 mg/dL.    Radiological Exams on  Admission: DG Chest 2 View  Result Date: 06/27/2022 CLINICAL DATA:  Tachycardia EXAM: CHEST - 2 VIEW COMPARISON:  12/27/2008 FINDINGS: The heart size and mediastinal contours are within normal limits. No focal airspace consolidation, pleural effusion, or pneumothorax. The visualized skeletal structures are unremarkable. IMPRESSION: No active cardiopulmonary disease. Electronically Signed   By: Duanne Guess D.O.   On: 06/27/2022 20:49    EKG: Independently reviewed. See EKG   Assessment/Plan -Influenza viral infection  -Acute hypoxic respiratory  -Admit to progressive care  -Continue with tamiflu -Wean O2 as able  -Supportive ivfs   OSA -cpap qhs   CKDIIIa -at baseline   GERD -ppi   obesity DVT prophylaxis: heparin Code Status: full Family Communication:  Disposition Plan: patient  expected to  be admitted greater than 2 midnights ) Consults called: n/a Admission status: progressive   Lurline Del MD [facility] If 7PM-7AM, please contact night-coverage [email or web address] Password [facility]  06/28/2022, 12:07 AM

## 2022-06-29 ENCOUNTER — Inpatient Hospital Stay (HOSPITAL_COMMUNITY): Payer: No Typology Code available for payment source

## 2022-06-29 DIAGNOSIS — I10 Essential (primary) hypertension: Secondary | ICD-10-CM | POA: Diagnosis not present

## 2022-06-29 DIAGNOSIS — M7989 Other specified soft tissue disorders: Secondary | ICD-10-CM | POA: Diagnosis not present

## 2022-06-29 DIAGNOSIS — J111 Influenza due to unidentified influenza virus with other respiratory manifestations: Secondary | ICD-10-CM | POA: Diagnosis not present

## 2022-06-29 DIAGNOSIS — J9601 Acute respiratory failure with hypoxia: Secondary | ICD-10-CM | POA: Diagnosis not present

## 2022-06-29 DIAGNOSIS — G4733 Obstructive sleep apnea (adult) (pediatric): Secondary | ICD-10-CM | POA: Diagnosis not present

## 2022-06-29 LAB — CBC WITH DIFFERENTIAL/PLATELET
Abs Immature Granulocytes: 0.01 10*3/uL (ref 0.00–0.07)
Basophils Absolute: 0 10*3/uL (ref 0.0–0.1)
Basophils Relative: 1 %
Eosinophils Absolute: 0 10*3/uL (ref 0.0–0.5)
Eosinophils Relative: 0 %
HCT: 39.6 % (ref 39.0–52.0)
Hemoglobin: 12.5 g/dL — ABNORMAL LOW (ref 13.0–17.0)
Immature Granulocytes: 0 %
Lymphocytes Relative: 21 %
Lymphs Abs: 0.9 10*3/uL (ref 0.7–4.0)
MCH: 27.2 pg (ref 26.0–34.0)
MCHC: 31.6 g/dL (ref 30.0–36.0)
MCV: 86.1 fL (ref 80.0–100.0)
Monocytes Absolute: 0.5 10*3/uL (ref 0.1–1.0)
Monocytes Relative: 12 %
Neutro Abs: 2.8 10*3/uL (ref 1.7–7.7)
Neutrophils Relative %: 66 %
Platelets: 172 10*3/uL (ref 150–400)
RBC: 4.6 MIL/uL (ref 4.22–5.81)
RDW: 14 % (ref 11.5–15.5)
WBC: 4.2 10*3/uL (ref 4.0–10.5)
nRBC: 0 % (ref 0.0–0.2)

## 2022-06-29 LAB — BASIC METABOLIC PANEL
Anion gap: 8 (ref 5–15)
BUN: 20 mg/dL (ref 6–20)
CO2: 24 mmol/L (ref 22–32)
Calcium: 8.3 mg/dL — ABNORMAL LOW (ref 8.9–10.3)
Chloride: 103 mmol/L (ref 98–111)
Creatinine, Ser: 1.56 mg/dL — ABNORMAL HIGH (ref 0.61–1.24)
GFR, Estimated: 52 mL/min — ABNORMAL LOW (ref >60)
Glucose, Bld: 130 mg/dL — ABNORMAL HIGH (ref 70–99)
Potassium: 4.3 mmol/L (ref 3.5–5.1)
Sodium: 135 mmol/L (ref 135–145)

## 2022-06-29 NOTE — Progress Notes (Signed)
Bilateral lower extremity venous duplex has been completed. Preliminary results can be found in CV Proc through chart review.   06/29/22 9:48 AM Olen Cordial RVT

## 2022-06-29 NOTE — Progress Notes (Signed)
Pt febrile during PM shift. T-max 102.0, tylenol administered, blankets removed, temp in room decreased, and ice packs applied. Provider notified. Monitored temp throughout shift. Temp now 99.2.

## 2022-06-29 NOTE — Progress Notes (Signed)
.   Transition of Care [facility]) Screening Note   Patient Details  Name: Marvin Gonzalez Date of Birth: Mar 26, 1966   Transition of [facility]) CM/SW Contact:    Larrie Kass, LCSW Phone Number: 06/29/2022, 10:37 AM    Transition of Care Department [facility]) has reviewed patient and no TOC needs have been identified at this time. We will continue to monitor patient advancement through interdisciplinary progression rounds. If new patient transition needs arise, please place a TOC consult.

## 2022-06-29 NOTE — Progress Notes (Signed)
[facility]                                                                               Marvin Gonzalez, is a 55 y.o. male, DOB - September 08, 1965, NTZ:[id number] Admit date - 06/27/2022    Outpatient Primary MD for the patient is [facility] [place] [facility]  LOS - 1  days    Brief summary    Marvin Gonzalez is a 56 y.o. male with medical history significant of  GERD, obesity, OSA on CPAP, CKD who present to ED with complaint of cough , congestion fever and shortness of breath x 1 day. Patient also noted his wife has the flu.   Assessment & Plan    Assessment and Plan:  Sepsis from Influenza A  Acute respiratory failure with hypoxia secondary to influenza A.  Pt is febrile,  tachycardic, tachypnea, hypertensive, hypoxia requiring 4 lit of [place] oxygen.  Influenza A positive.  COVID PCR is negative.  CXR is negative for pneumonia.  Continue with tamiflu 75 mg BID.  [place] oxygen to keep sats greater than 90%. Currently on 4 lit of [place] oxygen.  BIPAP as needed.  Flutter valve and incentive spirometry.  Continue with duo nebs and cough medications.    GERD;  Continue with PPI.    Hypertension:  BP parameters ae optimal.    OSA:  On CPAP.    CKD:  Stage 3a CKD.  Creatinine at baseline.    Body mass index is 48.33 kg/m. Morbid Obesity Recommend outpatient follow up with PCP .   Bilateral lower extremity edema;  Venous duplex of the lower extremity ordered to rule out DVT.      Estimated body mass index is 48.33 kg/m as calculated from the following:   Height as of this encounter: _0  (1.702 m).   Weight as of this encounter: 140 kg.  Code Status: full code.  DVT Prophylaxis:  heparin injection 5,000 Units Start: 06/28/22 0600   Level of Care: Level of care: Progressive Family Communication: none at bedside.   Disposition Plan:     Remains inpatient appropriate: acute respiratory failure with hypoxia.   Procedures:  None.    Consultants:   None.   Antimicrobials:   Anti-infectives (From admission, onward)    Start     Dose/Rate Route Frequency Ordered Stop   06/28/22 2000  azithromycin (ZITHROMAX) tablet 500 mg        500 mg Oral Daily 06/28/22 1354 07/01/22 1959   06/28/22 1030  oseltamivir (TAMIFLU) capsule 75 mg        75 mg Oral 2 times daily 06/28/22 0933 07/03/22 0959   06/27/22 2215  oseltamivir (TAMIFLU) capsule 75 mg        75 mg Oral  Once 06/27/22 2213 06/27/22 2310        Medications  Scheduled Meds:  azithromycin  500 mg Oral Q2000   heparin  5,000 Units Subcutaneous Q8H   oseltamivir  75 mg Oral BID   pantoprazole  40 mg Oral Daily   Continuous Infusions: PRN Meds:.acetaminophen **OR** acetaminophen, benzonatate, ipratropium-albuterol, ondansetron **OR** ondansetron (ZOFRAN) IV    Subjective:  Marvin Gonzalez was seen and examined today.  Breathing has much improved.   Objective:   Vitals:   06/29/22 0402 06/29/22 0500 06/29/22 0722 06/29/22 1151  BP:   114/68 (!) 143/80  Pulse:   77 81  Resp:   (!) 28 18  Temp: 99.2 F (37.3 C)  99 F (37.2 C) 99.2 F (37.3 C)  TempSrc: Oral  Oral Oral  SpO2:   96% 100%  Weight:  (!) 140 kg    Height:        Intake/Output Summary (Last 24 hours) at 06/29/2022 1319 Last data filed at 06/29/2022 0907 Gross per 24 hour  Intake 220 ml  Output 1050 ml  Net -830 ml    Filed Weights   06/27/22 2015 06/28/22 0500 06/29/22 0500  Weight: 117.9 kg (!) 138.3 kg (!) 140 kg     Exam General exam: Appears calm and comfortable  Respiratory system: Clear to auscultation. Respiratory effort normal. Cardiovascular system: S1 & S2 heard, RRR. No JVD, murmurs, Gastrointestinal system: Abdomen is nondistended, soft and nontender.  Central nervous system: Alert and oriented. No focal neurological deficits. Extremities: Symmetric 5 x 5 power. Skin: No rashes,  Psychiatry:  Mood & affect appropriate.      Data Reviewed:  I have  personally reviewed following labs and imaging studies   CBC Lab Results  Component Value Date   WBC 4.2 06/29/2022   RBC 4.60 06/29/2022   HGB 12.5 (L) 06/29/2022   HCT 39.6 06/29/2022   MCV 86.1 06/29/2022   MCH 27.2 06/29/2022   PLT 172 06/29/2022   MCHC 31.6 06/29/2022   RDW 14.0 06/29/2022   LYMPHSABS 0.9 06/29/2022   MONOABS 0.5 06/29/2022   EOSABS 0.0 06/29/2022   BASOSABS 0.0 86/57/8469     Last metabolic panel Lab Results  Component Value Date   NA 135 06/29/2022   K 4.3 06/29/2022   CL 103 06/29/2022   CO2 24 06/29/2022   BUN 20 06/29/2022   CREATININE 1.56 (H) 06/29/2022   GLUCOSE 130 (H) 06/29/2022   GFRNONAA 52 (L) 06/29/2022   GFRAA  12/27/2008    >60        The eGFR has been calculated using the MDRD equation. This calculation has not been validated in all clinical situations. eGFR's persistently <60 mL/min signify possible Chronic Kidney Disease.   CALCIUM 8.3 (L) 06/29/2022   PROT 7.4 06/28/2022   ALBUMIN 3.4 (L) 06/28/2022   BILITOT 0.6 06/28/2022   ALKPHOS 52 06/28/2022   AST 32 06/28/2022   ALT 29 06/28/2022   ANIONGAP 8 06/29/2022    CBG (last 3)  No results for input(s): "GLUCAP" in the last 72 hours.    Coagulation Profile: No results for input(s): "INR", "PROTIME" in the last 168 hours.   Radiology Studies: VAS [place] LOWER EXTREMITY VENOUS (DVT)  Result Date: 06/29/2022  Lower Venous DVT Study Patient Name:  Marvin Gonzalez  Date of Exam:   06/29/2022 Medical Rec #: [id number]     Accession #:    [id number] Date of Birth: 11/28/1965     Patient Gender: M Patient Age:   89 years Exam Location:  [facility] Procedure:      VAS [place] LOWER EXTREMITY VENOUS (DVT) Referring Phys: Hosie Poisson --------------------------------------------------------------------------------  Indications: Swelling.  Risk Factors: None identified. Limitations: Body habitus and poor ultrasound/tissue interface. Comparison Study: No prior studies. Performing  Technologist: Oliver Hum RVT  Examination Guidelines: A complete evaluation includes B-mode imaging, spectral Doppler,  color Doppler, and power Doppler as needed of all accessible portions of each vessel. Bilateral testing is considered an integral part of a complete examination. Limited examinations for reoccurring indications may be performed as noted. The reflux portion of the exam is performed with the patient in reverse Trendelenburg.  +---------+---------------+---------+-----------+----------+--------------+ RIGHT    CompressibilityPhasicitySpontaneityPropertiesThrombus Aging +---------+---------------+---------+-----------+----------+--------------+ CFV      Full           Yes      Yes                                 +---------+---------------+---------+-----------+----------+--------------+ SFJ      Full                                                        +---------+---------------+---------+-----------+----------+--------------+ FV Prox  Full                                                        +---------+---------------+---------+-----------+----------+--------------+ FV Mid   Full                                                        +---------+---------------+---------+-----------+----------+--------------+ FV DistalFull                                                        +---------+---------------+---------+-----------+----------+--------------+ PFV      Full                                                        +---------+---------------+---------+-----------+----------+--------------+ POP      Full           Yes      Yes                                 +---------+---------------+---------+-----------+----------+--------------+ PTV      Full                                                        +---------+---------------+---------+-----------+----------+--------------+ PERO     Full                                                         +---------+---------------+---------+-----------+----------+--------------+   +---------+---------------+---------+-----------+----------+--------------+  LEFT     CompressibilityPhasicitySpontaneityPropertiesThrombus Aging +---------+---------------+---------+-----------+----------+--------------+ CFV      Full           Yes      Yes                                 +---------+---------------+---------+-----------+----------+--------------+ SFJ      Full                                                        +---------+---------------+---------+-----------+----------+--------------+ FV Prox  Full                                                        +---------+---------------+---------+-----------+----------+--------------+ FV Mid   Full                                                        +---------+---------------+---------+-----------+----------+--------------+ FV DistalFull                                                        +---------+---------------+---------+-----------+----------+--------------+ PFV      Full                                                        +---------+---------------+---------+-----------+----------+--------------+ POP      Full           Yes      Yes                                 +---------+---------------+---------+-----------+----------+--------------+ PTV      Full                                                        +---------+---------------+---------+-----------+----------+--------------+ PERO     Full                                                        +---------+---------------+---------+-----------+----------+--------------+    Summary: RIGHT: - There is no evidence of deep vein thrombosis in the lower extremity. However, portions of this examination were limited- see technologist comments above.  - No cystic structure found in the popliteal fossa.  LEFT: - There is no  evidence of  deep vein thrombosis in the lower extremity. However, portions of this examination were limited- see technologist comments above.  - No cystic structure found in the popliteal fossa.  *See table(s) above for measurements and observations.    Preliminary    DG Chest 2 View  Result Date: 06/27/2022 CLINICAL DATA:  Tachycardia EXAM: CHEST - 2 VIEW COMPARISON:  12/27/2008 FINDINGS: The heart size and mediastinal contours are within normal limits. No focal airspace consolidation, pleural effusion, or pneumothorax. The visualized skeletal structures are unremarkable. IMPRESSION: No active cardiopulmonary disease. Electronically Signed   By: Davina Poke D.O.   On: 06/27/2022 20:49       Hosie Poisson M.D. [facility] 06/29/2022, 1:19 PM  Available via Epic secure chat 7am-7pm After 7 pm, please refer to night coverage provider listed on amion.

## 2022-06-30 ENCOUNTER — Inpatient Hospital Stay (HOSPITAL_COMMUNITY): Payer: No Typology Code available for payment source

## 2022-06-30 DIAGNOSIS — J111 Influenza due to unidentified influenza virus with other respiratory manifestations: Secondary | ICD-10-CM | POA: Diagnosis not present

## 2022-06-30 DIAGNOSIS — R0602 Shortness of breath: Secondary | ICD-10-CM

## 2022-06-30 LAB — ECHOCARDIOGRAM COMPLETE
Area-P 1/2: 3.5 cm2
Calc EF: 57.7 %
Height: 67 in
S' Lateral: 3.5 cm
Single Plane A2C EF: 59.4 %
Single Plane A4C EF: 57.9 %
Weight: 4832 oz

## 2022-06-30 MED ORDER — FUROSEMIDE 10 MG/ML IJ SOLN
40.0000 mg | Freq: Two times a day (BID) | INTRAMUSCULAR | Status: AC
  Administered 2022-06-30 – 2022-07-01 (×2): 40 mg via INTRAVENOUS
  Filled 2022-06-30 (×2): qty 4

## 2022-06-30 MED ORDER — POTASSIUM CHLORIDE 20 MEQ PO PACK
40.0000 meq | PACK | Freq: Once | ORAL | Status: AC
  Administered 2022-06-30: 40 meq via ORAL
  Filled 2022-06-30: qty 2

## 2022-06-30 NOTE — Plan of Care (Signed)
  Problem: Education: Goal: Knowledge of General Education information will improve Description Including pain rating scale, medication(s)/side effects and non-pharmacologic comfort measures Outcome: Progressing   Problem: Health Behavior/Discharge Planning: Goal: Ability to manage health-related needs will improve Outcome: Progressing   

## 2022-06-30 NOTE — Progress Notes (Signed)
  Echocardiogram 2D Echocardiogram has been performed.  Marvin Gonzalez 06/30/2022, 1:35 PM

## 2022-06-30 NOTE — Plan of Care (Signed)
  Problem: Clinical Measurements: Goal: Diagnostic test results will improve Outcome: Progressing Goal: Respiratory complications will improve Outcome: Progressing   Problem: Activity: Goal: Risk for activity intolerance will decrease Outcome: Progressing   

## 2022-06-30 NOTE — Progress Notes (Signed)
PROGRESS NOTE    Marvin Gonzalez  HWT:[id number] DOB: 05-31-1966 DOA: 06/27/2022 PCP: [facility] [place]   Marvin Gonzalez is a 56 y.o. male with medical history significant of  GERD, obesity, OSA on CPAP, CKD who present to ED with complaint of cough , congestion fever and shortness of breath x 1 day. Patient also noted his wife has the flu.    Subjective:   Assessment and Plan:  Sepsis from Influenza A  Acute respiratory failure with hypoxia secondary to influenza A.  Pt is febrile,  tachycardic, tachypnea, hypertensive, hypoxia requiring 4 lit of [place] oxygen.  Influenza A positive.  CXR is negative for pneumonia.  Continue with tamiflu 75 mg BID.  Wean O2 Flutter valve and incentive spirometry.  Add Iv lasix, ? Diastolic CHF  Lower ext edema -add IV lasix today -check ECHo, dopplers neg for DVT   GERD;  Continue with PPI.    Hypertension:  BP parameters ae optimal.    OSA:  On CPAP.    CKD:  Stage 3a CKD.  Creatinine at baseline.    Morbid Obesity, BMI 47 Recommend outpatient follow up with PCP .    DVT prophylaxis: hep SQ Code Status: Full Code Family Communication: none Disposition Plan: home in 1-2days  Consultants:    Procedures:   Antimicrobials:    Objective: Vitals:   06/29/22 2146 06/30/22 0347 06/30/22 0559 06/30/22 0603  BP:   107/71   Pulse: 81 67 69   Resp: (!) 25 16 16    Temp:   98.1 F (36.7 C)   TempSrc:   Oral   SpO2: 96% 96% 97%   Weight:    (!) 137 kg  Height:        Intake/Output Summary (Last 24 hours) at 06/30/2022 0946 Last data filed at 06/30/2022 0600 Gross per 24 hour  Intake 600 ml  Output 1500 ml  Net -900 ml   Filed Weights   06/28/22 0500 06/29/22 0500 06/30/22 0603  Weight: (!) 138.3 kg (!) 140 kg (!) 137 kg    Examination:  General exam: obese, AAOX3 Respiratory system: few basilar rales Cardiovascular system: S1 & S2 heard, RRR.  Abd: nondistended, soft and nontender.Normal bowel  sounds heard. Central nervous system: Alert and oriented. No focal neurological deficits. Extremities: 1 plus edema Skin: No rashes Psychiatry:  Mood & affect appropriate.     Data Reviewed:   CBC: Recent Labs  Lab 06/27/22 2056 06/28/22 0500 06/29/22 0417  WBC 7.8 6.8 4.2  NEUTROABS 6.4  --  2.8  HGB 13.0 12.8* 12.5*  HCT 41.0 41.5 39.6  MCV 85.4 87.6 86.1  PLT 206 193 172   Basic Metabolic Panel: Recent Labs  Lab 06/27/22 2056 06/28/22 0500 06/29/22 0417  NA 138 135 135  K 3.9 4.4 4.3  CL 107 103 103  CO2 23 26 24   GLUCOSE 163* 147* 130*  BUN 18 18 20   CREATININE 1.67* 1.77* 1.56*  CALCIUM 9.1 8.4* 8.3*   GFR: Estimated Creatinine Clearance: 71.5 mL/min (A) (by C-G formula based on SCr of 1.56 mg/dL (H)). Liver Function Tests: Recent Labs  Lab 06/27/22 2056 06/28/22 0500  AST 27 32  ALT 27 29  ALKPHOS 60 52  BILITOT 0.5 0.6  PROT 7.5 7.4  ALBUMIN 3.7 3.4*   No results for input(s): "LIPASE", "AMYLASE" in the last 168 hours. No results for input(s): "AMMONIA" in the last 168 hours. Coagulation Profile: No results for input(s): "INR", "PROTIME" in  the last 168 hours. Cardiac Enzymes: No results for input(s): "CKTOTAL", "CKMB", "CKMBINDEX", "TROPONINI" in the last 168 hours. BNP (last 3 results) No results for input(s): "PROBNP" in the last 8760 hours. HbA1C: No results for input(s): "HGBA1C" in the last 72 hours. CBG: No results for input(s): "GLUCAP" in the last 168 hours. Lipid Profile: No results for input(s): "CHOL", "HDL", "LDLCALC", "TRIG", "CHOLHDL", "LDLDIRECT" in the last 72 hours. Thyroid Function Tests: No results for input(s): "TSH", "T4TOTAL", "FREET4", "T3FREE", "THYROIDAB" in the last 72 hours. Anemia Panel: No results for input(s): "VITAMINB12", "FOLATE", "FERRITIN", "TIBC", "IRON", "RETICCTPCT" in the last 72 hours. Urine analysis:    Component Value Date/Time   COLORURINE YELLOW 12/27/2008 1119   APPEARANCEUR CLEAR 12/27/2008  1119   LABSPEC 1.021 12/27/2008 1119   PHURINE 6.0 12/27/2008 1119   GLUCOSEU 250 (A) 12/27/2008 1119   HGBUR NEGATIVE 12/27/2008 1119   BILIRUBINUR NEGATIVE 12/27/2008 1119   KETONESUR NEGATIVE 12/27/2008 1119   PROTEINUR NEGATIVE 12/27/2008 1119   UROBILINOGEN 1.0 12/27/2008 1119   NITRITE NEGATIVE 12/27/2008 1119   LEUKOCYTESUR  12/27/2008 1119    NEGATIVE MICROSCOPIC NOT DONE ON URINES WITH NEGATIVE PROTEIN, BLOOD, LEUKOCYTES, NITRITE, OR GLUCOSE <1000 mg/dL.   Sepsis Labs: @LABRCNTIP (procalcitonin:4,lacticidven:4)  ) Recent Results (from the past 240 hour(s))  Resp panel by RT-PCR (RSV, Flu A&B, Covid) Anterior Nasal Swab     Status: Abnormal   Collection Time: 06/27/22  9:10 PM   Specimen: Anterior Nasal Swab  Result Value Ref Range Status   SARS Coronavirus 2 by RT PCR NEGATIVE NEGATIVE Final    Comment: (NOTE) SARS-CoV-2 target nucleic acids are NOT DETECTED.  The SARS-CoV-2 RNA is generally detectable in upper respiratory specimens during the acute phase of infection. The lowest concentration of SARS-CoV-2 viral copies this assay can detect is 138 copies/mL. A negative result does not preclude SARS-Cov-2 infection and should not be used as the sole basis for treatment or other patient management decisions. A negative result may occur with  improper specimen collection/handling, submission of specimen other than nasopharyngeal swab, presence of viral mutation(s) within the areas targeted by this assay, and inadequate number of viral copies(<138 copies/mL). A negative result must be combined with clinical observations, patient history, and epidemiological information. The expected result is Negative.  Fact Sheet for Patients:  06/29/22  Fact Sheet for Healthcare Providers:  BloggerCourse.com  This test is no t yet approved or cleared by the [place] FDA and  has been authorized for detection and/or  diagnosis of SARS-CoV-2 by FDA under an Emergency Use Authorization (EUA). This EUA will remain  in effect (meaning this test can be used) for the duration of the COVID-19 declaration under Section 564(b)(1) of the Act, 21 U.S.C.section 360bbb-3(b)(1), unless the authorization is terminated  or revoked sooner.       Influenza A by PCR POSITIVE (A) NEGATIVE Final   Influenza B by PCR NEGATIVE NEGATIVE Final    Comment: (NOTE) The Xpert Xpress SARS-CoV-2/FLU/RSV plus assay is intended as an aid in the diagnosis of influenza from Nasopharyngeal swab specimens and should not be used as a sole basis for treatment. Nasal washings and aspirates are unacceptable for Xpert Xpress SARS-CoV-2/FLU/RSV testing.  Fact Sheet for Patients: [place]  Fact Sheet for Healthcare Providers: BloggerCourse.com  This test is not yet approved or cleared by the [place] FDA and has been authorized for detection and/or diagnosis of SARS-CoV-2 by FDA under an Emergency Use Authorization (EUA). This EUA will remain in effect (  meaning this test can be used) for the duration of the COVID-19 declaration under Section 564(b)(1) of the Act, 21 U.S.C. section 360bbb-3(b)(1), unless the authorization is terminated or revoked.     Resp Syncytial Virus by PCR NEGATIVE NEGATIVE Final    Comment: (NOTE) Fact Sheet for Patients: BloggerCourse.com  Fact Sheet for Healthcare Providers: SeriousBroker.it  This test is not yet approved or cleared by the [place] FDA and has been authorized for detection and/or diagnosis of SARS-CoV-2 by FDA under an Emergency Use Authorization (EUA). This EUA will remain in effect (meaning this test can be used) for the duration of the COVID-19 declaration under Section 564(b)(1) of the Act, 21 U.S.C. section 360bbb-3(b)(1), unless the authorization is terminated  or revoked.  Performed at [facility], [address] [place], [place] [address]   Culture, blood (Routine X 2) w Reflex to ID Panel     Status: None (Preliminary result)   Collection Time: 06/28/22  2:19 PM   Specimen: BLOOD  Result Value Ref Range Status   Specimen Description   Final    BLOOD BLOOD RIGHT ARM Performed at [facility], [address], [place], [place] [address]    Special Requests   Final    Blood Culture adequate volume BOTTLES DRAWN AEROBIC ONLY   Culture   Final    NO GROWTH < 24 HOURS Performed at [facility], [address] [place], [place] [address]    Report Status PENDING  Incomplete  Culture, blood (Routine X 2) w Reflex to ID Panel     Status: None (Preliminary result)   Collection Time: 06/28/22  2:19 PM   Specimen: BLOOD  Result Value Ref Range Status   Specimen Description   Final    BLOOD BLOOD LEFT ARM Performed at [facility], [address] [place], [place] [address]    Special Requests   Final    Blood Culture adequate volume BOTTLES DRAWN AEROBIC ONLY   Culture   Final    NO GROWTH < 24 HOURS Performed at [facility], [address] [place], [place] [address]    Report Status PENDING  Incomplete     Radiology Studies: VAS [place] LOWER EXTREMITY VENOUS (DVT)  Result Date: 06/29/2022  Lower Venous DVT Study Patient Name:  CORLEY MAFFEO  Date of Exam:   06/29/2022 Medical Rec #: [id number]     Accession #:    [id number] Date of Birth: 01-Sep-1965     Patient Gender: M Patient Age:   36 years Exam Location:  [facility] Procedure:      VAS [place] LOWER EXTREMITY VENOUS (DVT) Referring Phys: Kathlen Mody --------------------------------------------------------------------------------  Indications: Swelling.  Risk Factors: None identified. Limitations: Body habitus and poor ultrasound/tissue interface. Comparison Study: No prior studies. Performing Technologist: Chanda Busing RVT  Examination Guidelines: A complete evaluation includes B-mode imaging, spectral Doppler, color Doppler, and power Doppler as needed of all accessible portions of each vessel. Bilateral testing is considered an integral part of a complete examination. Limited examinations for reoccurring indications may be performed as noted. The reflux portion of the exam is performed with the patient in reverse Trendelenburg.  +---------+---------------+---------+-----------+----------+--------------+ RIGHT    CompressibilityPhasicitySpontaneityPropertiesThrombus Aging +---------+---------------+---------+-----------+----------+--------------+ CFV      Full           Yes      Yes                                 +---------+---------------+---------+-----------+----------+--------------+  SFJ      Full                                                        +---------+---------------+---------+-----------+----------+--------------+ FV Prox  Full                                                        +---------+---------------+---------+-----------+----------+--------------+ FV Mid   Full                                                        +---------+---------------+---------+-----------+----------+--------------+ FV DistalFull                                                        +---------+---------------+---------+-----------+----------+--------------+ PFV      Full                                                        +---------+---------------+---------+-----------+----------+--------------+ POP      Full           Yes      Yes                                 +---------+---------------+---------+-----------+----------+--------------+ PTV      Full                                                        +---------+---------------+---------+-----------+----------+--------------+ PERO     Full                                                         +---------+---------------+---------+-----------+----------+--------------+   +---------+---------------+---------+-----------+----------+--------------+ LEFT     CompressibilityPhasicitySpontaneityPropertiesThrombus Aging +---------+---------------+---------+-----------+----------+--------------+ CFV      Full           Yes      Yes                                 +---------+---------------+---------+-----------+----------+--------------+ SFJ      Full                                                        +---------+---------------+---------+-----------+----------+--------------+  FV Prox  Full                                                        +---------+---------------+---------+-----------+----------+--------------+ FV Mid   Full                                                        +---------+---------------+---------+-----------+----------+--------------+ FV DistalFull                                                        +---------+---------------+---------+-----------+----------+--------------+ PFV      Full                                                        +---------+---------------+---------+-----------+----------+--------------+ POP      Full           Yes      Yes                                 +---------+---------------+---------+-----------+----------+--------------+ PTV      Full                                                        +---------+---------------+---------+-----------+----------+--------------+ PERO     Full                                                        +---------+---------------+---------+-----------+----------+--------------+     Summary: RIGHT: - There is no evidence of deep vein thrombosis in the lower extremity. However, portions of this examination were limited- see technologist comments above.  - No cystic structure found in the popliteal fossa.  LEFT: - There is no evidence of deep vein  thrombosis in the lower extremity. However, portions of this examination were limited- see technologist comments above.  - No cystic structure found in the popliteal fossa.  *See table(s) above for measurements and observations. Electronically signed by Heath Larkhomas Hawken on 06/29/2022 at 4:29:12 PM.    Final      Scheduled Meds:  azithromycin  500 mg Oral Q2000   heparin  5,000 Units Subcutaneous Q8H   oseltamivir  75 mg Oral BID   pantoprazole  40 mg Oral Daily   Continuous Infusions:   LOS: 2 days    Time spent: 45min    Zannie CovePreetha Rayelynn Loyal, MD [facility]   06/30/2022, 9:46 AM

## 2022-07-01 ENCOUNTER — Other Ambulatory Visit (HOSPITAL_COMMUNITY): Payer: Self-pay

## 2022-07-01 DIAGNOSIS — J111 Influenza due to unidentified influenza virus with other respiratory manifestations: Secondary | ICD-10-CM | POA: Diagnosis not present

## 2022-07-01 LAB — BASIC METABOLIC PANEL
Anion gap: 6 (ref 5–15)
BUN: 20 mg/dL (ref 6–20)
CO2: 26 mmol/L (ref 22–32)
Calcium: 7.7 mg/dL — ABNORMAL LOW (ref 8.9–10.3)
Chloride: 102 mmol/L (ref 98–111)
Creatinine, Ser: 1.68 mg/dL — ABNORMAL HIGH (ref 0.61–1.24)
GFR, Estimated: 48 mL/min — ABNORMAL LOW (ref >60)
Glucose, Bld: 122 mg/dL — ABNORMAL HIGH (ref 70–99)
Potassium: 4.3 mmol/L (ref 3.5–5.1)
Sodium: 134 mmol/L — ABNORMAL LOW (ref 135–145)

## 2022-07-01 MED ORDER — FUROSEMIDE 10 MG/ML IJ SOLN
40.0000 mg | Freq: Two times a day (BID) | INTRAMUSCULAR | Status: AC
  Administered 2022-07-01 – 2022-07-02 (×2): 40 mg via INTRAVENOUS
  Filled 2022-07-01 (×2): qty 4

## 2022-07-01 MED ORDER — POTASSIUM CHLORIDE 20 MEQ PO PACK
40.0000 meq | PACK | Freq: Once | ORAL | Status: AC
  Administered 2022-07-01: 40 meq via ORAL
  Filled 2022-07-01: qty 2

## 2022-07-01 MED ORDER — EMPAGLIFLOZIN 10 MG PO TABS
10.0000 mg | ORAL_TABLET | Freq: Every day | ORAL | Status: DC
  Administered 2022-07-01 – 2022-07-02 (×2): 10 mg via ORAL
  Filled 2022-07-01 (×2): qty 1

## 2022-07-01 NOTE — Progress Notes (Signed)
Checked with patient x2 for nocturnal BiPAP. Att this time, he remains OOB, in the chair and does not wish to use BiPAP until he gets in bed. He agrees to call for assistance if needed when he is ready for bed.

## 2022-07-01 NOTE — Plan of Care (Signed)
  Problem: Health Behavior/Discharge Planning: Goal: Ability to manage health-related needs will improve Outcome: Progressing   Problem: Clinical Measurements: Goal: Ability to maintain clinical measurements within normal limits will improve Outcome: Progressing   

## 2022-07-01 NOTE — Plan of Care (Signed)
  Problem: Education: Goal: Knowledge of General Education information will improve Description Including pain rating scale, medication(s)/side effects and non-pharmacologic comfort measures Outcome: Progressing   Problem: Health Behavior/Discharge Planning: Goal: Ability to manage health-related needs will improve Outcome: Progressing   

## 2022-07-01 NOTE — TOC Benefit Eligibility Note (Signed)
Patient Product/process development scientist completed.    The patient is currently admitted and upon discharge could be taking Jardiance 10 mg.  The current 30 day co-pay is $25.00.   The patient is insured through [employer]   Roland Earl, CPHT Pharmacy Patient Advocate Specialist [facility] Patient Advocate Team Direct Number: [phone]  Fax: [phone]

## 2022-07-01 NOTE — Progress Notes (Signed)
PROGRESS NOTE    Marvin Gonzalez  GNF:[id number] DOB: 1965/08/01 DOA: 06/27/2022 PCP: Soundra PilonBrake, Andrew R, FNP   Marvin Gonzalez is a 56 y.o. male with medical history significant of  GERD, obesity, OSA on CPAP, CKD who present to ED with complaint of cough , congestion fever and shortness of breath x 1 day. Patient also noted his wife has the flu.    Subjective: Feels much better, urinated a lot yesterday, breathing improving   Assessment and Plan:  Sepsis from Influenza A  Acute respiratory failure with hypoxia secondary to influenza A.  Pt is febrile,  tachycardic, tachypnea, hypertensive, hypoxia requiring 4 lit of [place] oxygen.  Influenza A positive.  Continue with tamiflu 75 mg BID.  Wean O2 Flutter valve and incentive spirometry.  Component of CHF as well, IV Lasix as noted below  Acute on chronic diastolic CHF -Somewhat new diagnosis, has had longstanding edema without formal diagnosis -Repeat IV Lasix today, add Jardiance -2D echo with served EF, grade 2 diastolic dysfunction -Would be great candidate for TOC follow-up   GERD;  Continue with PPI.    Hypertension:  BP parameters ae optimal.    OSA:  On CPAP.    CKD:  Stage 3a CKD.  Creatinine at baseline.    Morbid Obesity, BMI 47 Recommend outpatient follow up with PCP .    DVT prophylaxis: hep SQ Code Status: Full Code Family Communication: none Disposition Plan: home in 1-2days  Consultants:    Procedures:   Antimicrobials:    Objective: Vitals:   06/30/22 1100 06/30/22 1225 06/30/22 2135 07/01/22 0457  BP:  115/75 107/65 108/68  Pulse:  68 80 74  Resp: (!) 24 18 19 20   Temp:   98.7 F (37.1 C) 98.1 F (36.7 C)  TempSrc:  Oral Oral Oral  SpO2:  98% 96% 98%  Weight:    (!) 136.8 kg  Height:        Intake/Output Summary (Last 24 hours) at 07/01/2022 [id number] Last data filed at 07/01/2022 0600 Gross per 24 hour  Intake --  Output 1500 ml  Net -1500 ml   Filed Weights   06/29/22 0500 06/30/22  0603 07/01/22 0457  Weight: (!) 140 kg (!) 137 kg (!) 136.8 kg    Examination:  General exam: Obese pleasant male sitting up, AAOx3, no distress HEENT: Positive JVD CVS: S1-S2, regular rhythm Lungs: Few basilar Rales otherwise clear Abdomen: Soft, nontender, bowel sounds present Extremities: 1+ edema Skin: No rashes Psychiatry:  Mood & affect appropriate.     Data Reviewed:   CBC: Recent Labs  Lab 06/27/22 2056 06/28/22 0500 06/29/22 0417  WBC 7.8 6.8 4.2  NEUTROABS 6.4  --  2.8  HGB 13.0 12.8* 12.5*  HCT 41.0 41.5 39.6  MCV 85.4 87.6 86.1  PLT 206 193 172   Basic Metabolic Panel: Recent Labs  Lab 06/27/22 2056 06/28/22 0500 06/29/22 0417 07/01/22 0514  NA 138 135 135 134*  K 3.9 4.4 4.3 4.3  CL 107 103 103 102  CO2 23 26 24 26   GLUCOSE 163* 147* 130* 122*  BUN 18 18 20 20   CREATININE 1.67* 1.77* 1.56* 1.68*  CALCIUM 9.1 8.4* 8.3* 7.7*   GFR: Estimated Creatinine Clearance: 66.3 mL/min (A) (by C-G formula based on SCr of 1.68 mg/dL (H)). Liver Function Tests: Recent Labs  Lab 06/27/22 2056 06/28/22 0500  AST 27 32  ALT 27 29  ALKPHOS 60 52  BILITOT 0.5 0.6  PROT 7.5 7.4  ALBUMIN  3.7 3.4*   No results for input(s): "LIPASE", "AMYLASE" in the last 168 hours. No results for input(s): "AMMONIA" in the last 168 hours. Coagulation Profile: No results for input(s): "INR", "PROTIME" in the last 168 hours. Cardiac Enzymes: No results for input(s): "CKTOTAL", "CKMB", "CKMBINDEX", "TROPONINI" in the last 168 hours. BNP (last 3 results) No results for input(s): "PROBNP" in the last 8760 hours. HbA1C: No results for input(s): "HGBA1C" in the last 72 hours. CBG: No results for input(s): "GLUCAP" in the last 168 hours. Lipid Profile: No results for input(s): "CHOL", "HDL", "LDLCALC", "TRIG", "CHOLHDL", "LDLDIRECT" in the last 72 hours. Thyroid Function Tests: No results for input(s): "TSH", "T4TOTAL", "FREET4", "T3FREE", "THYROIDAB" in the last 72  hours. Anemia Panel: No results for input(s): "VITAMINB12", "FOLATE", "FERRITIN", "TIBC", "IRON", "RETICCTPCT" in the last 72 hours. Urine analysis:    Component Value Date/Time   COLORURINE YELLOW 12/27/2008 1119   APPEARANCEUR CLEAR 12/27/2008 1119   LABSPEC 1.021 12/27/2008 1119   PHURINE 6.0 12/27/2008 1119   GLUCOSEU 250 (A) 12/27/2008 1119   HGBUR NEGATIVE 12/27/2008 1119   BILIRUBINUR NEGATIVE 12/27/2008 1119   KETONESUR NEGATIVE 12/27/2008 1119   PROTEINUR NEGATIVE 12/27/2008 1119   UROBILINOGEN 1.0 12/27/2008 1119   NITRITE NEGATIVE 12/27/2008 1119   LEUKOCYTESUR  12/27/2008 1119    NEGATIVE MICROSCOPIC NOT DONE ON URINES WITH NEGATIVE PROTEIN, BLOOD, LEUKOCYTES, NITRITE, OR GLUCOSE <1000 mg/dL.   Sepsis Labs: @LABRCNTIP (procalcitonin:4,lacticidven:4)  ) Recent Results (from the past 240 hour(s))  Resp panel by RT-PCR (RSV, Flu A&B, Covid) Anterior Nasal Swab     Status: Abnormal   Collection Time: 06/27/22  9:10 PM   Specimen: Anterior Nasal Swab  Result Value Ref Range Status   SARS Coronavirus 2 by RT PCR NEGATIVE NEGATIVE Final    Comment: (NOTE) SARS-CoV-2 target nucleic acids are NOT DETECTED.  The SARS-CoV-2 RNA is generally detectable in upper respiratory specimens during the acute phase of infection. The lowest concentration of SARS-CoV-2 viral copies this assay can detect is 138 copies/mL. A negative result does not preclude SARS-Cov-2 infection and should not be used as the sole basis for treatment or other patient management decisions. A negative result may occur with  improper specimen collection/handling, submission of specimen other than nasopharyngeal swab, presence of viral mutation(s) within the areas targeted by this assay, and inadequate number of viral copies(<138 copies/mL). A negative result must be combined with clinical observations, patient history, and epidemiological information. The expected result is Negative.  Fact Sheet for  Patients:  06/29/22  Fact Sheet for Healthcare Providers:  BloggerCourse.com  This test is no t yet approved or cleared by the [place] FDA and  has been authorized for detection and/or diagnosis of SARS-CoV-2 by FDA under an Emergency Use Authorization (EUA). This EUA will remain  in effect (meaning this test can be used) for the duration of the COVID-19 declaration under Section 564(b)(1) of the Act, 21 U.S.C.section 360bbb-3(b)(1), unless the authorization is terminated  or revoked sooner.       Influenza A by PCR POSITIVE (A) NEGATIVE Final   Influenza B by PCR NEGATIVE NEGATIVE Final    Comment: (NOTE) The Xpert Xpress SARS-CoV-2/FLU/RSV plus assay is intended as an aid in the diagnosis of influenza from Nasopharyngeal swab specimens and should not be used as a sole basis for treatment. Nasal washings and aspirates are unacceptable for Xpert Xpress SARS-CoV-2/FLU/RSV testing.  Fact Sheet for Patients: [place]  Fact Sheet for Healthcare Providers: BloggerCourse.com  This test is  not yet approved or cleared by the [place] and has been authorized for detection and/or diagnosis of SARS-CoV-2 by FDA under an Emergency Use Authorization (EUA). This EUA will remain in effect (meaning this test can be used) for the duration of the COVID-19 declaration under Section 564(b)(1) of the Act, 21 U.S.C. section 360bbb-3(b)(1), unless the authorization is terminated or revoked.     Resp Syncytial Virus by PCR NEGATIVE NEGATIVE Final    Comment: (NOTE) Fact Sheet for Patients: BloggerCourse.com  Fact Sheet for Healthcare Providers: Serious[employer]ker.it  This test is not yet approved or cleared by the [place] FDA and has been authorized for detection and/or diagnosis of SARS-CoV-2 by FDA under an  Emergency Use Authorization (EUA). This EUA will remain in effect (meaning this test can be used) for the duration of the COVID-19 declaration under Section 564(b)(1) of the Act, 21 U.S.C. section 360bbb-3(b)(1), unless the authorization is terminated or revoked.  Performed at [facility], [address] [place], [place] [address]   Culture, blood (Routine X 2) w Reflex to ID Panel     Status: None (Preliminary result)   Collection Time: 06/28/22  2:19 PM   Specimen: BLOOD  Result Value Ref Range Status   Specimen Description   Final    BLOOD BLOOD RIGHT ARM Performed at [facility], [address], [place], [place] [address]    Special Requests   Final    Blood Culture adequate volume BOTTLES DRAWN AEROBIC ONLY   Culture   Final    NO GROWTH 3 DAYS Performed at [facility], [address], [place], [place] [address]    Report Status PENDING  Incomplete  Culture, blood (Routine X 2) w Reflex to ID Panel     Status: None (Preliminary result)   Collection Time: 06/28/22  2:19 PM   Specimen: BLOOD  Result Value Ref Range Status   Specimen Description   Final    BLOOD BLOOD LEFT ARM Performed at [facility], [address], [place], [place] [address]    Special Requests   Final    Blood Culture adequate volume BOTTLES DRAWN AEROBIC ONLY   Culture   Final    NO GROWTH 3 DAYS Performed at [facility], [address] [place], [place] [address]    Report Status PENDING  Incomplete     Radiology Studies: ECHOCARDIOGRAM COMPLETE  Result Date: 06/30/2022    ECHOCARDIOGRAM REPORT   Patient Name:   Marvin Gonzalez Date of Exam: 06/30/2022 Medical Rec #:  [id number]    Height:       67.0 in Accession #:    [id number]   Weight:       302.0 lb Date of Birth:  09-22-1965    BSA:          2.409 m Patient Age:    56 years     BP:           115/75 mmHg Patient Gender: M            HR:           73 bpm. Exam Location:   Inpatient Procedure: 2D Echo, Cardiac Doppler, Color Doppler and Strain Analysis Indications:    R06.02 SOB  History:        Patient has no prior history of Echocardiogram examinations.                 Risk  Factors:Sleep Apnea. Influenza positive.  Sonographer:    Sheralyn Boatman RDCS Referring Phys: [phone] [facility]  Sonographer Comments: Technically difficult study due to poor echo windows, patient is obese and suboptimal subcostal window. Image acquisition challenging due to patient body habitus. IMPRESSIONS  1. Left ventricular ejection fraction, by estimation, is 55 to 60%. The left ventricle has normal function. The left ventricle has no regional wall motion abnormalities. Left ventricular diastolic parameters are consistent with Grade I diastolic dysfunction (impaired relaxation).  2. Right ventricular systolic function is normal. The right ventricular size is mildly enlarged.  3. The mitral valve is normal in structure. Trivial mitral valve regurgitation. No evidence of mitral stenosis.  4. The aortic valve is tricuspid. Aortic valve regurgitation is not visualized. No aortic stenosis is present. Comparison(s): No prior Echocardiogram. FINDINGS  Left Ventricle: Left ventricular ejection fraction, by estimation, is 55 to 60%. The left ventricle has normal function. The left ventricle has no regional wall motion abnormalities. Global longitudinal strain performed but not reported based on interpreter judgement due to suboptimal tracking. The left ventricular internal cavity size was normal in size. There is no left ventricular hypertrophy. Left ventricular diastolic parameters are consistent with Grade I diastolic dysfunction (impaired relaxation). Right Ventricle: The right ventricular size is mildly enlarged. No increase in right ventricular wall thickness. Right ventricular systolic function is normal. Left Atrium: Left atrial size was normal in size. Right Atrium: Right atrial size was normal in size.  Pericardium: There is no evidence of pericardial effusion. Mitral Valve: The mitral valve is normal in structure. Trivial mitral valve regurgitation. No evidence of mitral valve stenosis. Tricuspid Valve: The tricuspid valve is normal in structure. Tricuspid valve regurgitation is trivial. Aortic Valve: The aortic valve is tricuspid. Aortic valve regurgitation is not visualized. No aortic stenosis is present. Pulmonic Valve: The pulmonic valve was normal in structure. Pulmonic valve regurgitation is trivial. Aorta: The aortic root and ascending aorta are structurally normal, with no evidence of dilitation. Venous: The inferior vena cava was not well visualized. IAS/Shunts: The atrial septum is grossly normal.  LEFT VENTRICLE PLAX 2D LVIDd:         5.30 cm     Diastology LVIDs:         3.50 cm     LV e' medial:    7.51 cm/s LV PW:         1.10 cm     LV E/e' medial:  6.9 LV IVS:        1.00 cm     LV e' lateral:   9.79 cm/s LVOT diam:     2.40 cm     LV E/e' lateral: 5.3 LV SV:         62 LV SV Index:   26 LVOT Area:     4.52 cm  LV Volumes (MOD) LV vol d, MOD A2C: 83.1 ml LV vol d, MOD A4C: 98.8 ml LV vol s, MOD A2C: 33.7 ml LV vol s, MOD A4C: 41.6 ml LV SV MOD A2C:     49.4 ml LV SV MOD A4C:     98.8 ml LV SV MOD BP:      52.3 ml RIGHT VENTRICLE RV S prime:     11.70 cm/s TAPSE (M-mode): 1.6 cm LEFT ATRIUM             Index        RIGHT ATRIUM           Index LA diam:  3.60 cm 1.49 cm/m   RA Area:     19.20 cm LA Vol (A2C):   44.3 ml 18.39 ml/m  RA Volume:   55.20 ml  22.91 ml/m LA Vol (A4C):   39.5 ml 16.39 ml/m LA Biplane Vol: 42.0 ml 17.43 ml/m  AORTIC VALVE LVOT Vmax:   78.80 cm/s LVOT Vmean:  49.300 cm/s LVOT VTI:    0.138 m  AORTA Ao Root diam: 3.50 cm Ao Asc diam:  3.40 cm MITRAL VALVE MV Area (PHT): 3.50 cm    SHUNTS MV Decel Time: 217 msec    Systemic VTI:  0.14 m MV E velocity: 51.60 cm/s  Systemic Diam: 2.40 cm MV A velocity: 67.70 cm/s MV E/A ratio:  0.76 Laurance Flatten MD  Electronically signed by Laurance Flatten MD Signature Date/Time: 06/30/2022/1:52:13 PM    Final    VAS [place] LOWER EXTREMITY VENOUS (DVT)  Result Date: 06/29/2022  Lower Venous DVT Study Patient Name:  Marvin Gonzalez  Date of Exam:   06/29/2022 Medical Rec #: [id number]     Accession #:    [id number] Date of Birth: 01/07/1966     Patient Gender: M Patient Age:   89 years Exam Location:  [facility] Procedure:      VAS [place] LOWER EXTREMITY VENOUS (DVT) Referring Phys: Kathlen Mody --------------------------------------------------------------------------------  Indications: Swelling.  Risk Factors: None identified. Limitations: Body habitus and poor ultrasound/tissue interface. Comparison Study: No prior studies. Performing Technologist: Chanda Busing RVT  Examination Guidelines: A complete evaluation includes B-mode imaging, spectral Doppler, color Doppler, and power Doppler as needed of all accessible portions of each vessel. Bilateral testing is considered an integral part of a complete examination. Limited examinations for reoccurring indications may be performed as noted. The reflux portion of the exam is performed with the patient in reverse Trendelenburg.  +---------+---------------+---------+-----------+----------+--------------+ RIGHT    CompressibilityPhasicitySpontaneityPropertiesThrombus Aging +---------+---------------+---------+-----------+----------+--------------+ CFV      Full           Yes      Yes                                 +---------+---------------+---------+-----------+----------+--------------+ SFJ      Full                                                        +---------+---------------+---------+-----------+----------+--------------+ FV Prox  Full                                                        +---------+---------------+---------+-----------+----------+--------------+ FV Mid   Full                                                         +---------+---------------+---------+-----------+----------+--------------+ FV DistalFull                                                        +---------+---------------+---------+-----------+----------+--------------+  PFV      Full                                                        +---------+---------------+---------+-----------+----------+--------------+ POP      Full           Yes      Yes                                 +---------+---------------+---------+-----------+----------+--------------+ PTV      Full                                                        +---------+---------------+---------+-----------+----------+--------------+ PERO     Full                                                        +---------+---------------+---------+-----------+----------+--------------+   +---------+---------------+---------+-----------+----------+--------------+ LEFT     CompressibilityPhasicitySpontaneityPropertiesThrombus Aging +---------+---------------+---------+-----------+----------+--------------+ CFV      Full           Yes      Yes                                 +---------+---------------+---------+-----------+----------+--------------+ SFJ      Full                                                        +---------+---------------+---------+-----------+----------+--------------+ FV Prox  Full                                                        +---------+---------------+---------+-----------+----------+--------------+ FV Mid   Full                                                        +---------+---------------+---------+-----------+----------+--------------+ FV DistalFull                                                        +---------+---------------+---------+-----------+----------+--------------+ PFV      Full                                                         +---------+---------------+---------+-----------+----------+--------------+  POP      Full           Yes      Yes                                 +---------+---------------+---------+-----------+----------+--------------+ PTV      Full                                                        +---------+---------------+---------+-----------+----------+--------------+ PERO     Full                                                        +---------+---------------+---------+-----------+----------+--------------+     Summary: RIGHT: - There is no evidence of deep vein thrombosis in the lower extremity. However, portions of this examination were limited- see technologist comments above.  - No cystic structure found in the popliteal fossa.  LEFT: - There is no evidence of deep vein thrombosis in the lower extremity. However, portions of this examination were limited- see technologist comments above.  - No cystic structure found in the popliteal fossa.  *See table(s) above for measurements and observations. Electronically signed by Heath Lark on 06/29/2022 at 4:29:12 PM.    Final      Scheduled Meds:  heparin  5,000 Units Subcutaneous Q8H   oseltamivir  75 mg Oral BID   pantoprazole  40 mg Oral Daily   Continuous Infusions:   LOS: 3 days    Time spent:    Zannie Cove, MD [facility]   07/01/2022, 9:18 AM

## 2022-07-02 DIAGNOSIS — J111 Influenza due to unidentified influenza virus with other respiratory manifestations: Secondary | ICD-10-CM | POA: Diagnosis not present

## 2022-07-02 LAB — BASIC METABOLIC PANEL
Anion gap: 7 (ref 5–15)
BUN: 21 mg/dL — ABNORMAL HIGH (ref 6–20)
CO2: 26 mmol/L (ref 22–32)
Calcium: 8.5 mg/dL — ABNORMAL LOW (ref 8.9–10.3)
Chloride: 101 mmol/L (ref 98–111)
Creatinine, Ser: 1.63 mg/dL — ABNORMAL HIGH (ref 0.61–1.24)
GFR, Estimated: 49 mL/min — ABNORMAL LOW (ref >60)
Glucose, Bld: 131 mg/dL — ABNORMAL HIGH (ref 70–99)
Potassium: 4.3 mmol/L (ref 3.5–5.1)
Sodium: 134 mmol/L — ABNORMAL LOW (ref 135–145)

## 2022-07-02 MED ORDER — OSELTAMIVIR PHOSPHATE 75 MG PO CAPS: 75.0000 mg | ORAL_CAPSULE | Freq: Two times a day (BID) | ORAL | 0 refills | Status: AC

## 2022-07-02 MED ORDER — FUROSEMIDE 40 MG PO TABS
40.0000 mg | ORAL_TABLET | Freq: Every day | ORAL | Status: DC
  Filled 2022-07-02: qty 1

## 2022-07-02 MED ORDER — EMPAGLIFLOZIN 10 MG PO TABS: 10.0000 mg | ORAL_TABLET | Freq: Every day | ORAL | 0 refills | Status: DC

## 2022-07-02 MED ORDER — FUROSEMIDE 40 MG PO TABS: 40.0000 mg | ORAL_TABLET | Freq: Every day | ORAL | 0 refills | Status: DC

## 2022-07-02 MED ORDER — POTASSIUM CHLORIDE 20 MEQ PO PACK
20.0000 meq | PACK | Freq: Once | ORAL | Status: AC
  Administered 2022-07-02: 20 meq via ORAL
  Filled 2022-07-02: qty 1

## 2022-07-02 MED ORDER — POTASSIUM CHLORIDE CRYS ER 20 MEQ PO TBCR: 20.0000 meq | EXTENDED_RELEASE_TABLET | Freq: Every day | ORAL | 0 refills | Status: DC

## 2022-07-02 NOTE — Progress Notes (Signed)
PIV removed.  Discharge instructions and Living Better with Heart Failure book given to patient, reviewed with patient and patient's wife.  Both verbalized understanding.  Patient transport to main entrance via wheelchair with belongings for transport home with wife.  Bradd Burner, RN

## 2022-07-02 NOTE — TOC Transition Note (Signed)
Transition of Care [facility]) - CM/SW Discharge Note   Patient Details  Name: Marvin Gonzalez MRN: [id number] Date of Birth: Apr 30, 1966  [facility] CM/SW Contact:  Golda Acre, RN Phone Number: 07/02/2022, 10:05 AM   Clinical Narrative:    Patient discharged to return home. No toc needs present.   Final next level of care: Home/Self Care Barriers to Discharge: No Barriers Identified   Patient Goals and CMS Choice      Discharge Placement                         Discharge Plan and Services Additional resources added to the After Visit Summary for                                       Social Determinants of Health (SDOH) Interventions SDOH Screenings   Food Insecurity: No Food Insecurity (06/28/2022)  Housing: Low Risk  (06/28/2022)  Transportation Needs: No Transportation Needs (06/28/2022)  Utilities: Not At Risk (06/28/2022)  Tobacco Use: Low Risk  (06/28/2022)     Readmission Risk Interventions   No data to display

## 2022-07-02 NOTE — Plan of Care (Signed)

## 2022-07-03 LAB — CULTURE, BLOOD (ROUTINE X 2)
Culture: NO GROWTH
Culture: NO GROWTH
Special Requests: ADEQUATE
Special Requests: ADEQUATE

## 2022-07-03 NOTE — Discharge Summary (Signed)
Physician Discharge Summary  Neiman Roots BHA:[id number] DOB: Nov 08, 1965 DOA: 06/27/2022  PCP: Soundra Pilon, FNP  Admit date: 06/27/2022 Discharge date: 07/02/2022  Time spent: 35 minutes  Recommendations for Outpatient Follow-up:  PCP in 1 week, please check BMP at follow-up CHF TOC clinic in 2 weeks   Discharge Diagnoses:  Principal Problem:   Influenza Acute diastolic CHF Obesity OSA CKD 3a Hypertension GERD  Discharge Condition: Improved  Diet recommendation: Low-salt  Filed Weights   06/30/22 0603 07/01/22 0457 07/02/22 0500  Weight: (!) 137 kg (!) 136.8 kg 132.1 kg    History of present illness:  Shamell Suarez is a 56 y.o. male with medical history significant of  GERD, obesity, OSA on CPAP, CKD who present to ED with complaint of cough , congestion fever and shortness of breath x 1 day   Hospital Course:   Sepsis from Influenza A  Acute respiratory failure with hypoxia secondary to influenza A.  Admitted with fever, tachycardic, tachypnea, hypertensive, hypoxia requiring 4 lit of [place] oxygen.  Influenza A positive.  Continue with tamiflu 75 mg BID.  -Improved and stable, weaned off O2, treated for CHF as well as noted below   Acute on chronic diastolic CHF -Somewhat new diagnosis, has had longstanding edema without formal diagnosis -Diuresed with IV Lasix, lost 17 pounds of fluid weight, transition to oral Lasix, started on Jardiance -2D echo with served EF, grade 2 diastolic dysfunction -Message sent to [facility] for follow-up   GERD;  Continue with PPI.    Hypertension:  BP parameters ae optimal.    OSA:  On CPAP.    CKD:  Stage 3a CKD.  Creatinine at baseline.    Morbid Obesity, BMI 47 Recommend outpatient follow up with PCP .  Discharge Exam: Vitals:   07/01/22 2253 07/02/22 0630  BP: 118/78 (!) 110/52  Pulse: 78 76  Resp: 16 (!) 21  Temp: 97.9 F (36.6 C) 98.2 F (36.8 C)  SpO2: 98% 96%   General exam: Obese pleasant male  sitting up, AAOx3, no distress CVS: S1-S2, regular rhythm Lungs: Few basilar Rales otherwise clear Abdomen: Soft, nontender, bowel sounds present Extremities: trace edema Skin: No rashes Psychiatry:  Mood & affect appropriate.     Discharge Instructions   Discharge Instructions     Diet - low sodium heart healthy   Complete by: As directed    Increase activity slowly   Complete by: As directed       Allergies as of 07/02/2022       Reactions   Succinylsulphathiazole Anaphylaxis   Sulfa Antibiotics Rash, Anaphylaxis        Medication List     STOP taking these medications    benzonatate 100 MG capsule Commonly known as: TESSALON   Diurex Max 50 MG Tabs Generic drug: Pamabrom       TAKE these medications    empagliflozin 10 MG Tabs tablet Commonly known as: JARDIANCE Take 1 tablet (10 mg total) by mouth daily.   furosemide 40 MG tablet Commonly known as: LASIX Take 1 tablet (40 mg total) by mouth daily.   omeprazole 20 MG tablet Commonly known as: PRILOSEC OTC Take 20 mg by mouth daily.   oseltamivir 75 MG capsule Commonly known as: TAMIFLU Take 1 capsule (75 mg total) by mouth 2 (two) times daily for 2 days.   potassium chloride SA 20 MEQ tablet Commonly known as: KLOR-CON M Take 1 tablet (20 mEq total) by mouth daily.  Allergies  Allergen Reactions   Succinylsulphathiazole Anaphylaxis   Sulfa Antibiotics Rash and Anaphylaxis    Follow-up Information     [facility]. Go in 13 day(s).   Specialty: Cardiology Why: Hospital follow up 07/15/2022 @ 9 am PLEASE bring a current medication list to appointment FREE valet parking, Entrance C, off [employer] information: [address] [id number] mc [place] [address] [phone]                 The results of significant diagnostics from this hospitalization (including imaging, microbiology, ancillary  and laboratory) are listed below for reference.    Significant Diagnostic Studies: ECHOCARDIOGRAM COMPLETE  Result Date: 06/30/2022    ECHOCARDIOGRAM REPORT   Patient Name:   BRETT DARKO Date of Exam: 06/30/2022 Medical Rec #:  [id number]    Height:       67.0 in Accession #:    [id number]   Weight:       302.0 lb Date of Birth:  07-Dec-1965    BSA:          2.409 m Patient Age:    56 years     BP:           115/75 mmHg Patient Gender: M            HR:           73 bpm. Exam Location:  Inpatient Procedure: 2D Echo, Cardiac Doppler, Color Doppler and Strain Analysis Indications:    R06.02 SOB  History:        Patient has no prior history of Echocardiogram examinations.                 Risk Factors:Sleep Apnea. Influenza positive.  Sonographer:    Sheralyn Boatman RDCS Referring Phys: [phone] [facility]  Sonographer Comments: Technically difficult study due to poor echo windows, patient is obese and suboptimal subcostal window. Image acquisition challenging due to patient body habitus. IMPRESSIONS  1. Left ventricular ejection fraction, by estimation, is 55 to 60%. The left ventricle has normal function. The left ventricle has no regional wall motion abnormalities. Left ventricular diastolic parameters are consistent with Grade I diastolic dysfunction (impaired relaxation).  2. Right ventricular systolic function is normal. The right ventricular size is mildly enlarged.  3. The mitral valve is normal in structure. Trivial mitral valve regurgitation. No evidence of mitral stenosis.  4. The aortic valve is tricuspid. Aortic valve regurgitation is not visualized. No aortic stenosis is present. Comparison(s): No prior Echocardiogram. FINDINGS  Left Ventricle: Left ventricular ejection fraction, by estimation, is 55 to 60%. The left ventricle has normal function. The left ventricle has no regional wall motion abnormalities. Global longitudinal strain performed but not reported based on interpreter judgement due to  suboptimal tracking. The left ventricular internal cavity size was normal in size. There is no left ventricular hypertrophy. Left ventricular diastolic parameters are consistent with Grade I diastolic dysfunction (impaired relaxation). Right Ventricle: The right ventricular size is mildly enlarged. No increase in right ventricular wall thickness. Right ventricular systolic function is normal. Left Atrium: Left atrial size was normal in size. Right Atrium: Right atrial size was normal in size. Pericardium: There is no evidence of pericardial effusion. Mitral Valve: The mitral valve is normal in structure. Trivial mitral valve regurgitation. No evidence of mitral valve stenosis. Tricuspid Valve: The tricuspid valve is normal in structure. Tricuspid valve regurgitation is trivial. Aortic Valve: The aortic valve is  tricuspid. Aortic valve regurgitation is not visualized. No aortic stenosis is present. Pulmonic Valve: The pulmonic valve was normal in structure. Pulmonic valve regurgitation is trivial. Aorta: The aortic root and ascending aorta are structurally normal, with no evidence of dilitation. Venous: The inferior vena cava was not well visualized. IAS/Shunts: The atrial septum is grossly normal.  LEFT VENTRICLE PLAX 2D LVIDd:         5.30 cm     Diastology LVIDs:         3.50 cm     LV e' medial:    7.51 cm/s LV PW:         1.10 cm     LV E/e' medial:  6.9 LV IVS:        1.00 cm     LV e' lateral:   9.79 cm/s LVOT diam:     2.40 cm     LV E/e' lateral: 5.3 LV SV:         62 LV SV Index:   26 LVOT Area:     4.52 cm  LV Volumes (MOD) LV vol d, MOD A2C: 83.1 ml LV vol d, MOD A4C: 98.8 ml LV vol s, MOD A2C: 33.7 ml LV vol s, MOD A4C: 41.6 ml LV SV MOD A2C:     49.4 ml LV SV MOD A4C:     98.8 ml LV SV MOD BP:      52.3 ml RIGHT VENTRICLE RV S prime:     11.70 cm/s TAPSE (M-mode): 1.6 cm LEFT ATRIUM             Index        RIGHT ATRIUM           Index LA diam:        3.60 cm 1.49 cm/m   RA Area:     19.20 cm LA Vol  (A2C):   44.3 ml 18.39 ml/m  RA Volume:   55.20 ml  22.91 ml/m LA Vol (A4C):   39.5 ml 16.39 ml/m LA Biplane Vol: 42.0 ml 17.43 ml/m  AORTIC VALVE LVOT Vmax:   78.80 cm/s LVOT Vmean:  49.300 cm/s LVOT VTI:    0.138 m  AORTA Ao Root diam: 3.50 cm Ao Asc diam:  3.40 cm MITRAL VALVE MV Area (PHT): 3.50 cm    SHUNTS MV Decel Time: 217 msec    Systemic VTI:  0.14 m MV E velocity: 51.60 cm/s  Systemic Diam: 2.40 cm MV A velocity: 67.70 cm/s MV E/A ratio:  0.76 Laurance Flatten MD Electronically signed by Laurance Flatten MD Signature Date/Time: 06/30/2022/1:52:13 PM    Final    VAS [place] LOWER EXTREMITY VENOUS (DVT)  Result Date: 06/29/2022  Lower Venous DVT Study Patient Name:  ARLIND KLINGERMAN  Date of Exam:   06/29/2022 Medical Rec #: [id number]     Accession #:    [id number] Date of Birth: [birth year]     Patient Gender: M Patient Age:   55 years Exam Location:  [facility] Procedure:      VAS [place] LOWER EXTREMITY VENOUS (DVT) Referring Phys: Kathlen Mody --------------------------------------------------------------------------------  Indications: Swelling.  Risk Factors: None identified. Limitations: Body habitus and poor ultrasound/tissue interface. Comparison Study: No prior studies. Performing Technologist: Chanda Busing RVT  Examination Guidelines: A complete evaluation includes B-mode imaging, spectral Doppler, color Doppler, and power Doppler as needed of all accessible portions of each vessel. Bilateral testing is considered an integral part of a complete examination. Limited examinations for  reoccurring indications may be performed as noted. The reflux portion of the exam is performed with the patient in reverse Trendelenburg.  +---------+---------------+---------+-----------+----------+--------------+ RIGHT    CompressibilityPhasicitySpontaneityPropertiesThrombus Aging +---------+---------------+---------+-----------+----------+--------------+ CFV      Full           Yes      Yes                                  +---------+---------------+---------+-----------+----------+--------------+ SFJ      Full                                                        +---------+---------------+---------+-----------+----------+--------------+ FV Prox  Full                                                        +---------+---------------+---------+-----------+----------+--------------+ FV Mid   Full                                                        +---------+---------------+---------+-----------+----------+--------------+ FV DistalFull                                                        +---------+---------------+---------+-----------+----------+--------------+ PFV      Full                                                        +---------+---------------+---------+-----------+----------+--------------+ POP      Full           Yes      Yes                                 +---------+---------------+---------+-----------+----------+--------------+ PTV      Full                                                        +---------+---------------+---------+-----------+----------+--------------+ PERO     Full                                                        +---------+---------------+---------+-----------+----------+--------------+   +---------+---------------+---------+-----------+----------+--------------+ LEFT     CompressibilityPhasicitySpontaneityPropertiesThrombus Aging +---------+---------------+---------+-----------+----------+--------------+ CFV      Full  Yes      Yes                                 +---------+---------------+---------+-----------+----------+--------------+ SFJ      Full                                                        +---------+---------------+---------+-----------+----------+--------------+ FV Prox  Full                                                         +---------+---------------+---------+-----------+----------+--------------+ FV Mid   Full                                                        +---------+---------------+---------+-----------+----------+--------------+ FV DistalFull                                                        +---------+---------------+---------+-----------+----------+--------------+ PFV      Full                                                        +---------+---------------+---------+-----------+----------+--------------+ POP      Full           Yes      Yes                                 +---------+---------------+---------+-----------+----------+--------------+ PTV      Full                                                        +---------+---------------+---------+-----------+----------+--------------+ PERO     Full                                                        +---------+---------------+---------+-----------+----------+--------------+     Summary: RIGHT: - There is no evidence of deep vein thrombosis in the lower extremity. However, portions of this examination were limited- see technologist comments above.  - No cystic structure found in the popliteal fossa.  LEFT: - There is no evidence of deep vein thrombosis in the lower extremity. However, portions of this examination were limited- see technologist comments above.  - No cystic structure found  in the popliteal fossa.  *See table(s) above for measurements and observations. Electronically signed by Heath Lark on 06/29/2022 at 4:29:12 PM.    Final    DG Chest 2 View  Result Date: 06/27/2022 CLINICAL DATA:  Tachycardia EXAM: CHEST - 2 VIEW COMPARISON:  12/27/2008 FINDINGS: The heart size and mediastinal contours are within normal limits. No focal airspace consolidation, pleural effusion, or pneumothorax. The visualized skeletal structures are unremarkable. IMPRESSION: No active cardiopulmonary disease. Electronically  Signed   By: Duanne Guess D.O.   On: 06/27/2022 20:49    Microbiology: Recent Results (from the past 240 hour(s))  Resp panel by RT-PCR (RSV, Flu A&B, Covid) Anterior Nasal Swab     Status: Abnormal   Collection Time: 06/27/22  9:10 PM   Specimen: Anterior Nasal Swab  Result Value Ref Range Status   SARS Coronavirus 2 by RT PCR NEGATIVE NEGATIVE Final    Comment: (NOTE) SARS-CoV-2 target nucleic acids are NOT DETECTED.  The SARS-CoV-2 RNA is generally detectable in upper respiratory specimens during the acute phase of infection. The lowest concentration of SARS-CoV-2 viral copies this assay can detect is 138 copies/mL. A negative result does not preclude SARS-Cov-2 infection and should not be used as the sole basis for treatment or other patient management decisions. A negative result may occur with  improper specimen collection/handling, submission of specimen other than nasopharyngeal swab, presence of viral mutation(s) within the areas targeted by this assay, and inadequate number of viral copies(<138 copies/mL). A negative result must be combined with clinical observations, patient history, and epidemiological information. The expected result is Negative.  Fact Sheet for Patients:  BloggerCourse.com  Fact Sheet for Healthcare Providers:  SeriousBroker.it  This test is no t yet approved or cleared by the [place] FDA and  has been authorized for detection and/or diagnosis of SARS-CoV-2 by FDA under an Emergency Use Authorization (EUA). This EUA will remain  in effect (meaning this test can be used) for the duration of the COVID-19 declaration under Section 564(b)(1) of the Act, 21 U.S.C.section 360bbb-3(b)(1), unless the authorization is terminated  or revoked sooner.       Influenza A by PCR POSITIVE (A) NEGATIVE Final   Influenza B by PCR NEGATIVE NEGATIVE Final    Comment: (NOTE) The Xpert Xpress  SARS-CoV-2/FLU/RSV plus assay is intended as an aid in the diagnosis of influenza from Nasopharyngeal swab specimens and should not be used as a sole basis for treatment. Nasal washings and aspirates are unacceptable for Xpert Xpress SARS-CoV-2/FLU/RSV testing.  Fact Sheet for Patients: BloggerCourse.com  Fact Sheet for Healthcare Providers: SeriousBroker.it  This test is not yet approved or cleared by the [place] FDA and has been authorized for detection and/or diagnosis of SARS-CoV-2 by FDA under an Emergency Use Authorization (EUA). This EUA will remain in effect (meaning this test can be used) for the duration of the COVID-19 declaration under Section 564(b)(1) of the Act, 21 U.S.C. section 360bbb-3(b)(1), unless the authorization is terminated or revoked.     Resp Syncytial Virus by PCR NEGATIVE NEGATIVE Final    Comment: (NOTE) Fact Sheet for Patients: BloggerCourse.com  Fact Sheet for Healthcare Providers: SeriousBroker.it  This test is not yet approved or cleared by the [place] FDA and has been authorized for detection and/or diagnosis of SARS-CoV-2 by FDA under an Emergency Use Authorization (EUA). This EUA will remain in effect (meaning this test can be used) for the duration of the COVID-19 declaration under Section 564(b)(1) of the Act,  21 U.S.C. section 360bbb-3(b)(1), unless the authorization is terminated or revoked.  Performed at [facility], [address], [place], [place] [address]   Culture, blood (Routine X 2) w Reflex to ID Panel     Status: None   Collection Time: 06/28/22  2:19 PM   Specimen: BLOOD  Result Value Ref Range Status   Specimen Description   Final    BLOOD BLOOD RIGHT ARM Performed at [facility], [address], [place], [place] [address]    Special Requests   Final    Blood Culture  adequate volume BOTTLES DRAWN AEROBIC ONLY   Culture   Final    NO GROWTH 5 DAYS Performed at [facility], [address], [place], [place] [address]    Report Status 07/03/2022 FINAL  Final  Culture, blood (Routine X 2) w Reflex to ID Panel     Status: None   Collection Time: 06/28/22  2:19 PM   Specimen: BLOOD  Result Value Ref Range Status   Specimen Description   Final    BLOOD BLOOD LEFT ARM Performed at [facility], [address], [place], [place] [address]    Special Requests   Final    Blood Culture adequate volume BOTTLES DRAWN AEROBIC ONLY   Culture   Final    NO GROWTH 5 DAYS Performed at [facility], [address], [place], [place] [address]    Report Status 07/03/2022 FINAL  Final     Labs: Basic Metabolic Panel: Recent Labs  Lab 06/27/22 2056 06/28/22 0500 06/29/22 0417 07/01/22 0514 07/02/22 0740  NA 138 135 135 134* 134*  K 3.9 4.4 4.3 4.3 4.3  CL 107 103 103 102 101  CO2 23 26 24 26 26   GLUCOSE 163* 147* 130* 122* 131*  BUN 18 18 20 20  21*  CREATININE 1.67* 1.77* 1.56* 1.68* 1.63*  CALCIUM 9.1 8.4* 8.3* 7.7* 8.5*   Liver Function Tests: Recent Labs  Lab 06/27/22 2056 06/28/22 0500  AST 27 32  ALT 27 29  ALKPHOS 60 52  BILITOT 0.5 0.6  PROT 7.5 7.4  ALBUMIN 3.7 3.4*   No results for input(s): "LIPASE", "AMYLASE" in the last 168 hours. No results for input(s): "AMMONIA" in the last 168 hours. CBC: Recent Labs  Lab 06/27/22 2056 06/28/22 0500 06/29/22 0417  WBC 7.8 6.8 4.2  NEUTROABS 6.4  --  2.8  HGB 13.0 12.8* 12.5*  HCT 41.0 41.5 39.6  MCV 85.4 87.6 86.1  PLT 206 193 172   Cardiac Enzymes: No results for input(s): "CKTOTAL", "CKMB", "CKMBINDEX", "TROPONINI" in the last 168 hours. BNP: BNP (last 3 results) Recent Labs    06/27/22 2100  BNP 13.7    ProBNP (last 3 results) No results for input(s): "PROBNP" in the last 8760 hours.  CBG: No results for input(s): "GLUCAP" in the last 168  hours.     Signed:  07/01/22 MD.  [facility] 07/03/2022, 2:50 PM

## 2022-07-15 ENCOUNTER — Encounter (HOSPITAL_COMMUNITY): Payer: No Typology Code available for payment source

## 2022-07-15 ENCOUNTER — Other Ambulatory Visit (HOSPITAL_COMMUNITY): Payer: Self-pay

## 2022-07-15 NOTE — Progress Notes (Signed)
   [facility] Transitions of Care Clinic Heart Failure Pharmacist Encounter  PCP: Kristen Loader, FNP PCP-Cardiologist: None  HPI:  57 yo male with PMH significant for GERD, obesity, OSA on CPAP, and CKD stage 3a. Pt was admitted to [facility] hospital 12/25-12/29/2023 for acute respiratory failure secondary to influenza. He had noticed increasing edema in the week following admission. ECHO on 06/30/2022 showed EF 55-60% with grade I diastolic dysfunction.   Today, Marvin Gonzalez presents to the [facility] for follow up. He reports general fatigue, but denies SOB walking on flat ground. He endorses medication adherence, fluid restriction, and following a low sodium diet.   HF Medications: Diuretic: furosemide 40mg  once daily SGLT2i: Jardiance 10mg  once daily   Has the patient been experiencing any side effects to the medications prescribed?  no  Does the patient have any problems obtaining medications due to transportation or finances?   Yes- copay was expensive with [facility] but showed $25 with our pharmacy  Understanding of regimen: good Understanding of indications: excellent Potential of compliance: good Patient understands to avoid NSAIDs. Patient understands to avoid decongestants.   Vital Signs: Weight: 296 lbs (discharge weight: 291 lbs) Blood pressure: 106/50  Heart rate: 90   Medication Assistance / Insurance Benefits Check: Does the patient have prescription insurance?  Yes Type of insurance plan: [employer]    Outpatient Pharmacy:  Current outpatient pharmacy: [facility] Was the [facility] pharmacy used to supply discharge medications? no  Is the patient willing to transition their outpatient pharmacy to utilize a [facility] outpatient pharmacy with or without mail order?   Yes- refills sent for Jardiance to [facility]  Assessment: 1) Chronic systolic CHF (EF 25-85%), due to NICM. NYHA class II symptoms. - Continue  Jardiance 10mg  once daily -Continue furosemide 40mg  once daily  -Soft BP does not allow for Entresto or MRA initiation at this time.   Plan: 1) Medication changes: - none at this time  2) Patient Assistance: -Jardiance copay $25; patient was provided with Jardiance copay card  3) Follow up: - Next appointment with cardiology on 08/20/2022  Maryan Puls, PharmD PGY-1 [facility] Pharmacy Resident

## 2022-07-16 ENCOUNTER — Ambulatory Visit (HOSPITAL_COMMUNITY)
Admission: RE | Admit: 2022-07-16 | Discharge: 2022-07-16 | Disposition: A | Discharge status: Home / Self Care | Payer: No Typology Code available for payment source | Source: Ambulatory Visit | Attending: Adult Health | Admitting: Adult Health

## 2022-07-16 ENCOUNTER — Encounter (HOSPITAL_COMMUNITY): Payer: Self-pay

## 2022-07-16 ENCOUNTER — Other Ambulatory Visit (HOSPITAL_COMMUNITY): Payer: Self-pay

## 2022-07-16 VITALS — BP 106/50 | HR 90 | Ht 67.0 in | Wt 296.6 lb

## 2022-07-16 DIAGNOSIS — R21 Rash and other nonspecific skin eruption: Secondary | ICD-10-CM

## 2022-07-16 DIAGNOSIS — K219 Gastro-esophageal reflux disease without esophagitis: Secondary | ICD-10-CM | POA: Insufficient documentation

## 2022-07-16 DIAGNOSIS — Z7984 Long term (current) use of oral hypoglycemic drugs: Secondary | ICD-10-CM | POA: Insufficient documentation

## 2022-07-16 DIAGNOSIS — Z8249 Family history of ischemic heart disease and other diseases of the circulatory system: Secondary | ICD-10-CM | POA: Diagnosis not present

## 2022-07-16 DIAGNOSIS — N1831 Chronic kidney disease, stage 3a: Secondary | ICD-10-CM | POA: Diagnosis not present

## 2022-07-16 DIAGNOSIS — M7989 Other specified soft tissue disorders: Secondary | ICD-10-CM | POA: Diagnosis not present

## 2022-07-16 DIAGNOSIS — Z79899 Other long term (current) drug therapy: Secondary | ICD-10-CM | POA: Insufficient documentation

## 2022-07-16 DIAGNOSIS — I5032 Chronic diastolic (congestive) heart failure: Secondary | ICD-10-CM | POA: Diagnosis not present

## 2022-07-16 DIAGNOSIS — G4733 Obstructive sleep apnea (adult) (pediatric): Secondary | ICD-10-CM | POA: Diagnosis not present

## 2022-07-16 DIAGNOSIS — E669 Obesity, unspecified: Secondary | ICD-10-CM | POA: Insufficient documentation

## 2022-07-16 DIAGNOSIS — Z6841 Body Mass Index (BMI) 40.0 and over, adult: Secondary | ICD-10-CM | POA: Insufficient documentation

## 2022-07-16 DIAGNOSIS — L309 Dermatitis, unspecified: Secondary | ICD-10-CM | POA: Diagnosis not present

## 2022-07-16 DIAGNOSIS — Z7985 Long-term (current) use of injectable non-insulin antidiabetic drugs: Secondary | ICD-10-CM | POA: Insufficient documentation

## 2022-07-16 DIAGNOSIS — N183 Chronic kidney disease, stage 3 unspecified: Secondary | ICD-10-CM | POA: Diagnosis not present

## 2022-07-16 LAB — BASIC METABOLIC PANEL
Anion gap: 9 (ref 5–15)
BUN: 16 mg/dL (ref 6–20)
CO2: 26 mmol/L (ref 22–32)
Calcium: 9.2 mg/dL (ref 8.9–10.3)
Chloride: 102 mmol/L (ref 98–111)
Creatinine, Ser: 1.42 mg/dL — ABNORMAL HIGH (ref 0.61–1.24)
GFR, Estimated: 58 mL/min — ABNORMAL LOW (ref >60)
Glucose, Bld: 171 mg/dL — ABNORMAL HIGH (ref 70–99)
Potassium: 4 mmol/L (ref 3.5–5.1)
Sodium: 137 mmol/L (ref 135–145)

## 2022-07-16 LAB — CBC
HCT: 41.2 % (ref 39.0–52.0)
Hemoglobin: 13.5 g/dL (ref 13.0–17.0)
MCH: 27.8 pg (ref 26.0–34.0)
MCHC: 32.8 g/dL (ref 30.0–36.0)
MCV: 84.8 fL (ref 80.0–100.0)
Platelets: 334 10*3/uL (ref 150–400)
RBC: 4.86 MIL/uL (ref 4.22–5.81)
RDW: 13.6 % (ref 11.5–15.5)
WBC: 7.4 10*3/uL (ref 4.0–10.5)
nRBC: 0 % (ref 0.0–0.2)

## 2022-07-16 LAB — HEMOGLOBIN A1C
Hgb A1c MFr Bld: 7.3 % — ABNORMAL HIGH (ref 4.8–5.6)
Mean Plasma Glucose: 162.81 mg/dL

## 2022-07-16 LAB — IRON AND TIBC
Iron: 54 ug/dL (ref 45–182)
Saturation Ratios: 17 % — ABNORMAL LOW (ref 17.9–39.5)
TIBC: 316 ug/dL (ref 250–450)
UIBC: 262 ug/dL

## 2022-07-16 LAB — BRAIN NATRIURETIC PEPTIDE: B Natriuretic Peptide: 3.1 pg/mL (ref 0.0–100.0)

## 2022-07-16 LAB — FERRITIN: Ferritin: 405 ng/mL — ABNORMAL HIGH (ref 24–336)

## 2022-07-16 MED ORDER — FUROSEMIDE 40 MG PO TABS
40.0000 mg | ORAL_TABLET | Freq: Every day | ORAL | 0 refills | Status: DC
  Filled 2022-07-16: qty 30, 30d supply, fill #0

## 2022-07-16 MED ORDER — EMPAGLIFLOZIN 10 MG PO TABS: 10.0000 mg | ORAL_TABLET | Freq: Every day | ORAL | 0 refills | Status: DC

## 2022-07-16 MED ORDER — POTASSIUM CHLORIDE CRYS ER 20 MEQ PO TBCR
20.0000 meq | EXTENDED_RELEASE_TABLET | Freq: Every day | ORAL | 0 refills | Status: DC
  Filled 2022-07-16: qty 30, 30d supply, fill #0

## 2022-07-16 MED ORDER — EMPAGLIFLOZIN 10 MG PO TABS
10.0000 mg | ORAL_TABLET | Freq: Every day | ORAL | 0 refills | Status: DC
  Filled 2022-07-16: qty 30, 30d supply, fill #0

## 2022-07-16 MED ORDER — POTASSIUM CHLORIDE CRYS ER 20 MEQ PO TBCR: 20.0000 meq | EXTENDED_RELEASE_TABLET | Freq: Every day | ORAL | 0 refills | Status: DC

## 2022-07-16 MED ORDER — FUROSEMIDE 40 MG PO TABS: 40.0000 mg | ORAL_TABLET | Freq: Every day | ORAL | 0 refills | Status: DC

## 2022-07-16 NOTE — Patient Instructions (Signed)
Labs done today. We will contact you only if your labs are abnormal.  No medication changes were made. Please continue all current medications as prescribed.  You have been referred to Nephrology. They will contact you to schedule an appointment.   Your physician recommends that you schedule a follow-up appointment in: 4 weeks  If you have any questions or concerns before your next appointment please send [place] a message through [place] or call our office at [phone].    TO LEAVE A MESSAGE FOR THE NURSE SELECT OPTION 2, PLEASE LEAVE A MESSAGE INCLUDING: YOUR NAME DATE OF BIRTH CALL BACK NUMBER REASON FOR CALL**this is important as we prioritize the call backs  YOU WILL RECEIVE A CALL BACK THE SAME DAY AS LONG AS YOU CALL BEFORE 4:00 PM   Do the following things EVERYDAY: Weigh yourself in the morning before breakfast. Write it down and keep it in a log. Take your medicines as prescribed Eat low salt foods--Limit salt (sodium) to 2000 mg per day.  Stay as active as you can everyday Limit all fluids for the day to less than 2 liters

## 2022-07-16 NOTE — Progress Notes (Addendum)
HEART & VASCULAR TRANSITION OF CARE CONSULT NOTE     Referring Physician: Dr. Broadus John Primary Care: Kristen Loader, FNP Primary Cardiologist:  HPI: Referred to clinic by Dr. Broadus John for heart failure consultation.   57 y.o. with a past medication history of GERD, obesity, OSA on CPAP, CKD 3a, and new diagnosis of diastolic heart failure.  Admitted 12/23 for acute respiratory failure secondary to influenza. Treated with oseltamivir. Echo showed EF 55-60% with grade I diastolic dysfunction.  Diuresed with IV lasix and transitioned to Jardiance and daily Lasix. Discharged home, weight 291 lbs (diuresed 17 lbs during admission).  Today he presents to [facility] for post hospital follow up. Overall feeling fair, remains generally fatigued. He has SOB if he is rushing, but otherwise no SOB walking on flat ground. 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  Has RLE swelling, wearing compression socks. Noticed itchy rash on abdomen. Denies palpitations, CP, dizziness, or PND/Orthopnea. Appetite ok. No fever or chills. Weight at home 291 pounds. Taking all medications. No family history of HF. No ETOH/tobacco/drug use. Wears CPAP at home. Enjoys working out, but has not been able to for past 7 months.  Family Hx: mother with HTN, grandfather with MI  Cardiac Testing  - Echo (12/23): EF 55-60%, grade I DD  Review of Systems: [y] = yes, [ ]  = no   General: Weight gain [ ] ; Weight loss [ ] ; Anorexia [ ] ; Fatigue Blue.Reese ]; Fever [ ] ; Chills [ ] ; Weakness [ ]   Cardiac: Chest pain/pressure [ ] ; Resting SOB [ ] ; Exertional SOB Blue.Reese ]; Orthopnea [ ] ; Pedal Edema [ ] ; Palpitations [ ] ; Syncope [ ] ; Presyncope [ ] ; Paroxysmal nocturnal dyspnea[ ]   Pulmonary: Cough [ ] ; Wheezing[ ] ; Hemoptysis[ ] ; Sputum [ ] ; Snoring Blue.Reese ]  GI: Vomiting[ ] ; Dysphagia[ ] ; Melena[ ] ; Hematochezia [ ] ; Heartburn[ ] ; Abdominal pain [ ] ; Constipation [ ] ; Diarrhea [ ] ; BRBPR [ ]   GU: Hematuria[ ] ; Dysuria [ ] ; Nocturia[ ]   Vascular:  Pain in legs with walking [ ] ; Pain in feet with lying flat [ ] ; Non-healing sores [ ] ; Stroke [ ] ; TIA [ ] ; Slurred speech [ ] ;  Neuro: Headaches[ ] ; Vertigo[ ] ; Seizures[ ] ; Paresthesias[ ] ;Blurred vision [ ] ; Diplopia [ ] ; Vision changes [ ]   Ortho/Skin: Arthritis [ ] ; Joint pain [ ] ; Muscle pain [ ] ; Joint swelling [ ] ; Back Pain [ ] ; Rash Blue.Reese ]  Psych: Depression[ ] ; Anxiety[ ]   Heme: Bleeding problems [ ] ; Clotting disorders [ ] ; Anemia [ ]   Endocrine: Diabetes [ ] ; Thyroid dysfunction[ ]   Past Medical History:  Diagnosis Date   GERD (gastroesophageal reflux disease)    Obesity    OSA on CPAP    Renal disorder    Current Outpatient Medications  Medication Sig Dispense Refill   empagliflozin (JARDIANCE) 10 MG TABS tablet Take 1 tablet (10 mg total) by mouth daily. 30 tablet 0   furosemide (LASIX) 40 MG tablet Take 1 tablet (40 mg total) by mouth daily. 30 tablet 0   omeprazole (PRILOSEC OTC) 20 MG tablet Take 20 mg by mouth daily.     potassium chloride SA (KLOR-CON M) 20 MEQ tablet Take 1 tablet (20 mEq total) by mouth daily. 30 tablet 0   No current facility-administered medications for this encounter.   Allergies  Allergen Reactions   Succinylsulphathiazole Anaphylaxis   Sulfa Antibiotics Rash and Anaphylaxis   Social History   Socioeconomic History  Marital status: Married    Spouse name: Not on file   Number of children: Not on file   Years of education: Not on file   Highest education level: Not on file  Occupational History   Not on file  Tobacco Use   Smoking status: Never   Smokeless tobacco: Never   Tobacco comments:    Never smoke 07/16/22  Substance and Sexual Activity   Alcohol use: Not Currently   Drug use: Not Currently   Sexual activity: Not on file  Other Topics Concern   Not on file  Social History Narrative   Not on file   Social Determinants of Health   Financial Resource Strain: Not on file  Food Insecurity: No Food Insecurity  (06/28/2022)   Hunger Vital Sign    Worried About Running Out of Food in the Last Year: Never true    Ran Out of Food in the Last Year: Never true  Transportation Needs: No Transportation Needs (06/28/2022)   PRAPARE - Hydrologist (Medical): No    Lack of Transportation (Non-Medical): No  Physical Activity: Not on file  Stress: Not on file  Social Connections: Not on file  Intimate Partner Violence: Not At Risk (06/28/2022)   Humiliation, Afraid, Rape, and Kick questionnaire    Fear of Current or Ex-Partner: No    Emotionally Abused: No    Physically Abused: No    Sexually Abused: No   Family History  Problem Relation Age of Onset   Diabetes Mellitus I Father    Hypertension Father    Coronary artery disease Father    Diabetes Mellitus I Sister    Hypertension Sister    BP (!) 106/50   Pulse 90   Ht 5\' 7"  (1.702 m)   Wt 134.5 kg (296 lb 9.6 oz)   SpO2 95%   BMI 46.45 kg/m   Wt Readings from Last 3 Encounters:  07/16/22 134.5 kg (296 lb 9.6 oz)  07/02/22 132.1 kg (291 lb 3.2 oz)  01/03/21 117.9 kg (260 lb)   PHYSICAL EXAM: General:  NAD. No resp difficulty HEENT: Normal Neck: Supple. No JVD, thick neck. Carotids 2+ bilat; no bruits. No lymphadenopathy or thryomegaly appreciated. Cor: PMI nondisplaced. Regular rate & rhythm. No rubs, gallops or murmurs. Lungs: Clear Abdomen: Soft, obese, nontender, nondistended. No hepatosplenomegaly. No bruits or masses. Good bowel sounds. Dry skin on abdomen Extremities: No cyanosis, clubbing, rash, 1+ RLE edema Neuro: Alert & oriented x 3, cranial nerves grossly intact. Moves all 4 extremities w/o difficulty. Affect pleasant.  ECG:  NSR 87 bpm  ASSESSMENT & PLAN: Chronic Diastolic Heart Failure - Echo 12/23 EF 55-60%, grade I DD - NYHA II, volume OK. - Continue compression hose for LEE - Continue Jardiance 10 mg daily. No GU symptoms, no candida on abdomen (see below) - Continue Lasix 40 mg daily  + 20 KCL daily. OK to take extra 40 PRN weight gain/SOB. - Hold off on MRA/ARB with CKD and marginal BP - No BP room for beta blocker, would avoid with on-going fatigue. - Labs today. Refill all meds.  2. CKD 3 - Baseline SCr 1.5-1.7 - Avoid NSAIDs - On SGLT2i - Refer to Nephrology. - Check iron panel  3. OSA - On CPAP.  4. Obesity - Body mass index is 46.45 kg/m. - Consider referral to PharmD for [place]  5. Abdominal rash - Looks like dermatitis, no candidal infection. - Try OTC hydrocortisone cream.  NYHA  II GDMT  Diuretic-Continue Lasix 40 mg daily. BB- no BP room to add Ace/ARB/ARNI- no BP room to add, hold off with CKD 3 MRA- hold off wth CKD 3 SGLT2i- Continue Jardiance 10 mg daily.   Referred to HFSW (PCP, Medications, Transportation, ETOH Abuse, Drug Abuse, Insurance, Financial ): No Refer to Pharmacy: No Refer to Home Health: No Refer to Advanced Heart Failure Clinic: No  Refer to General Cardiology: Yes, will place referral next visit.  Follow up in TOC in 4 weeks for fluid check. If stable, refer to general cardiology to establish care.  Prince Rome, FNP-BC 07/16/22

## 2022-07-19 ENCOUNTER — Other Ambulatory Visit (HOSPITAL_COMMUNITY): Payer: Self-pay

## 2022-07-20 ENCOUNTER — Other Ambulatory Visit (HOSPITAL_COMMUNITY): Payer: Self-pay

## 2022-07-23 ENCOUNTER — Other Ambulatory Visit (HOSPITAL_BASED_OUTPATIENT_CLINIC_OR_DEPARTMENT_OTHER): Payer: Self-pay

## 2022-08-06 NOTE — H&P (Addendum)
History and Physical    Patient: Marvin Gonzalez NFA:[id number] DOB: [birth year] DOA: 06/27/2022 DOS: the patient was seen and examined on 08/06/2022 PCP: Kristen Loader, FNP  Patient coming from: Home  Chief Complaint:  Chief Complaint  Patient presents with   Fever   HPI: Marvin Gonzalez is a 57 y.o. male with medical history significant of GERD, obesity, OSA on CPAP, CKD who present to ED with complaint of cough , congestion fever and shortness of breath x 1 day as well as increasing lower extremity edema x 1 week.  Patient of note has sick contact in his  wife. Patient notes no chest pain no n/v/d or abdominal pain. Review of Systems: As mentioned in the history of present illness. All other systems reviewed and are negative. Past Medical History:  Diagnosis Date   GERD (gastroesophageal reflux disease)    Obesity    OSA on CPAP    Renal disorder    Past Surgical History:  Procedure Laterality Date   TONSILLECTOMY AND ADENOIDECTOMY  1996   Social History:  reports that he has never smoked. He has never used smokeless tobacco. He reports that he does not currently use alcohol. He reports that he does not currently use drugs.  Allergies  Allergen Reactions   Succinylsulphathiazole Anaphylaxis   Sulfa Antibiotics Rash and Anaphylaxis    Family History  Problem Relation Age of Onset   Diabetes Mellitus I Father    Hypertension Father    Coronary artery disease Father    Diabetes Mellitus I Sister    Hypertension Sister     Prior to Admission medications   Medication Sig Start Date End Date Taking? Authorizing Provider  omeprazole (PRILOSEC OTC) 20 MG tablet Take 20 mg by mouth daily.   Yes [provider]  empagliflozin (JARDIANCE) 10 MG TABS tablet Take 1 tablet (10 mg total) by mouth daily. 07/16/22   [place], Maricela Bo, FNP  furosemide (LASIX) 40 MG tablet Take 1 tablet (40 mg total) by mouth daily. 07/16/22   [place], Maricela Bo, FNP  potassium chloride SA (KLOR-CON  M) 20 MEQ tablet Take 1 tablet (20 mEq total) by mouth daily. 07/16/22   Rafael Bihari, FNP    Physical Exam: Vitals:   07/01/22 2239 07/01/22 2253 07/02/22 0500 07/02/22 0630  BP:  118/78  (!) 110/52  Pulse: 75 78  76  Resp: 17 16  (!) 21  Temp:  97.9 F (36.6 C)  98.2 F (36.8 C)  TempSrc:  Oral  Oral  SpO2: 94% 98%  96%  Weight:   132.1 kg   Height:       BP (!) 145/85  Pulse (!) 115  Temp (!) 101.9 F (38.8 C) (Oral)  Resp 18  Ht 5\' 7"  (1.702 m)  Wt 117.9 kg  SpO2 96%  BMI 40.72 kg/m  GEN:  NAD  CV: s1s2 tachycardic regular  Lungs: + wheezing/crackles  Abdomen: +bs ntnd  Ext: + edema , no other abnormalities noted Skin: warm , no rashes  Neuro: Alert and oriented x 3 , non focal , MAE x 4  Psych: normal affect/mood     Data Reviewed: Component Value Date    WBC 6.8 06/28/2022    RBC 4.74 06/28/2022    HGB 12.8 (L) 06/28/2022    HCT 41.5 06/28/2022    MCV 87.6 06/28/2022    MCH 27.0 06/28/2022    PLT 193 06/28/2022    MCHC 30.8 06/28/2022    RDW  14.0 06/28/2022    LYMPHSABS 0.5 (L) 06/27/2022    MONOABS 0.7 06/27/2022    EOSABS 0.1 06/27/2022    BASOSABS 0.0 06/27/2022            Lab Results  Component Value Date    NA 135 06/28/2022    K 4.4 06/28/2022    CL 103 06/28/2022    CO2 26 06/28/2022    BUN 18 06/28/2022    CREATININE 1.77 (H) 06/28/2022    GLUCOSE 147 (H) 06/28/2022    GFRNONAA 45 (L) 06/28/2022    GFRAA   12/27/2008      >60        The eGFR has been calculated using the MDRD equation. This calculation has not been validated in all clinical situations. eGFR's persistently <60 mL/min signify possible Chronic Kidney Disease.    CALCIUM 8.4 (L) 06/28/2022    PROT 7.4 06/28/2022    ALBUMIN 3.4 (L) 06/28/2022    BILITOT 0.6 06/28/2022    ALKPHOS 52 06/28/2022    AST 32 06/28/2022    ALT 29 06/28/2022    ANIONGAP 6 06/28/2022   DG Chest 2 View   Result Date: 06/27/2022 CLINICAL DATA:  Tachycardia EXAM: CHEST - 2 VIEW  COMPARISON:  12/27/2008 FINDINGS: The heart size and mediastinal contours are within normal limits. No focal airspace consolidation, pleural effusion, or pneumothorax. The visualized skeletal structures are unremarkable. IMPRESSION: No active cardiopulmonary disease. Electronically Signed   By: Davina Poke D.O.   On: 06/27/2022 20:49        Assessment and Plan: Acute respiratroy failure with hypoxemia due to influenza A  -admit to med tele -start tamiflu  -wean O2 as able  -supportive care, pulmonary toilet /nebs  Hypertension -stable -resume home regimen as able once med rec completed   OSA -cpap   CKDIIIa -at baseline   Lower extremity edema  -bnp low , ? Venostasis  -further evaluation base on progression of symptoms   Advance Care Planning:  code full  Consults: n/a  Family Communication: n/a  Severity of Illness:  The appropriate patient status for this patient is OBSERVATION. Observation status is judged to be reasonable and necessary in order to provide the required intensity of service to ensure the patient's safety. The patient's presenting symptoms, physical exam findings, and initial radiographic and laboratory data in the context of their medical condition is felt to place them at decreased risk for further clinical deterioration. Furthermore, it is anticipated that the patient will be medically stable for discharge from the hospital within 2 midnights of admission.   Author: Clance Boll, MD 08/06/2022 7:21 PM  For on call review [email or web address].

## 2022-08-18 NOTE — Progress Notes (Signed)
HEART & VASCULAR TRANSITION OF CARE NOTE     Referring Physician: Dr. Broadus John Primary Care: Kristen Loader, FNP  HPI: Referred to clinic by Dr. Broadus John for heart failure consultation.   57 y.o. with a past medication history of GERD, obesity, OSA on CPAP, CKD 3a, and new diagnosis of diastolic heart failure.  Admitted 12/23 for acute respiratory failure secondary to influenza. Treated with oseltamivir. Echo showed EF 55-60% with grade I diastolic dysfunction.  Diuresed with IV lasix and transitioned to Jardiance and daily Lasix. Discharged home, weight 291 lbs (diuresed 17 lbs during admission).  Post hospital TOC visit 1/24, NYHA II and volume stable. Remained fatigued. GDMT limited  by soft BP.  Today he returns to [facility] for follow up. Overall feeling fine. He is not SOB with walking on flat ground or working out. He is back to working out 4-5 days/week. Doing full body work out and TM 45 minutes. Denies palpitations, CP, dizziness, edema, or PND/Orthopnea. Appetite ok. No fever or chills. Weight at home 288-290 pounds. Taking all medications. Wears CPAP, takes extra lasix occasionally. No ETOH/tobacco/drug use. 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  Family Hx: mother with HTN, grandfather with MI  Cardiac Testing  - Echo (12/23): EF 55-60%, grade I DD  Past Medical History:  Diagnosis Date   GERD (gastroesophageal reflux disease)    Obesity    OSA on CPAP    Renal disorder    Current Outpatient Medications  Medication Sig Dispense Refill   empagliflozin (JARDIANCE) 10 MG TABS tablet Take 1 tablet (10 mg) by mouth daily. 30 tablet 0   furosemide (LASIX) 40 MG tablet Take 1 tablet (40 mg) by mouth daily. 30 tablet 0   omeprazole (PRILOSEC OTC) 20 MG tablet Take 20 mg by mouth daily.     potassium chloride SA (KLOR-CON M) 20 MEQ tablet Take 1 tablet (20 mEq) by mouth daily. 30 tablet 0   No current facility-administered medications for this encounter.   Allergies  Allergen  Reactions   Succinylsulphathiazole Anaphylaxis   Sulfa Antibiotics Rash and Anaphylaxis   Social History   Socioeconomic History   Marital status: Married    Spouse name: Not on file   Number of children: Not on file   Years of education: Not on file   Highest education level: Not on file  Occupational History   Not on file  Tobacco Use   Smoking status: Never   Smokeless tobacco: Never   Tobacco comments:    Never smoke 07/16/22  Substance and Sexual Activity   Alcohol use: Not Currently   Drug use: Not Currently   Sexual activity: Not on file  Other Topics Concern   Not on file  Social History Narrative   Not on file   Social Determinants of Health   Financial Resource Strain: Not on file  Food Insecurity: No Food Insecurity (06/28/2022)   Hunger Vital Sign    Worried About Running Out of Food in the Last Year: Never true    Ran Out of Food in the Last Year: Never true  Transportation Needs: No Transportation Needs (06/28/2022)   PRAPARE - Hydrologist (Medical): No    Lack of Transportation (Non-Medical): No  Physical Activity: Not on file  Stress: Not on file  Social Connections: Not on file  Intimate Partner Violence: Not At Risk (06/28/2022)   Humiliation, Afraid, Rape, and Kick questionnaire    Fear of Current  or Ex-Partner: No    Emotionally Abused: No    Physically Abused: No    Sexually Abused: No   Family History  Problem Relation Age of Onset   Diabetes Mellitus I Father    Hypertension Father    Coronary artery disease Father    Diabetes Mellitus I Sister    Hypertension Sister    BP 118/78   Pulse 77   Wt 132.9 kg (293 lb)   SpO2 96%   BMI 45.89 kg/m   Wt Readings from Last 3 Encounters:  08/20/22 132.9 kg (293 lb)  07/16/22 134.5 kg (296 lb 9.6 oz)  07/02/22 132.1 kg (291 lb 3.2 oz)   PHYSICAL EXAM: General:  NAD. No resp difficulty, walked into clinic HEENT: Normal Neck: Supple. No JVD, thick neck.  Carotids 2+ bilat; no bruits. No lymphadenopathy or thryomegaly appreciated. Cor: PMI nondisplaced. Regular rate & rhythm. No rubs, gallops or murmurs. Lungs: Clear Abdomen: Soft, nontender, nondistended. No hepatosplenomegaly. No bruits or masses. Good bowel sounds. Extremities: No cyanosis, clubbing, rash, edema Neuro: Alert & oriented x 3, cranial nerves grossly intact. Moves all 4 extremities w/o difficulty. Affect pleasant.  ASSESSMENT & PLAN: Chronic Diastolic Heart Failure - Echo 12/23 EF 55-60%, grade I DD - NYHA I, volume looks good today, weight down 3 lbs. - Consider adding beta blocker next. Trial low dose, do not want to inhibit exercise tolerance. - Continue Jardiance 10 mg daily. No GU symptoms. - Continue Lasix 40 mg daily + 20 KCL daily. OK to take extra 40 PRN weight gain/SOB. - Hold off on MRA/ARB with CKD and marginal BP - Labs today.  2. CKD 3 - Baseline SCr 1.5-1.7 - Avoid NSAIDs - On SGLT2i - He has been referred to Nephrology.  3. OSA - On CPAP. - PCP arranging sleep medication follow up to help with new mask.  4. Obesity - Body mass index is 45.89 kg/m. - Consider referral to PharmD for GLP1RA   NYHA I GDMT  Diuretic-Continue Lasix 40 mg daily. BB- consider low dose next if BP allows. Ace/ARB/ARNI- hold off with CKD 3 MRA- hold off wth CKD 3 SGLT2i- Continue Jardiance 10 mg daily.   Referred to HFSW (PCP, Medications, Transportation, ETOH Abuse, Drug Abuse, Insurance, Financial ): No Refer to Pharmacy: No Refer to Home Health: No Refer to Advanced Heart Failure Clinic: No  Refer to General Cardiology: Yes  Refer to general cardiology to establish care. Will be happy to see back at [facility] if needed in the future.  Allena Katz, FNP-BC 08/20/22

## 2022-08-19 ENCOUNTER — Other Ambulatory Visit (HOSPITAL_COMMUNITY): Payer: Self-pay | Admitting: Family Medicine

## 2022-08-19 ENCOUNTER — Other Ambulatory Visit (HOSPITAL_COMMUNITY): Payer: Self-pay

## 2022-08-19 ENCOUNTER — Telehealth (HOSPITAL_COMMUNITY): Payer: Self-pay

## 2022-08-19 MED ORDER — FUROSEMIDE 40 MG PO TABS
40.0000 mg | ORAL_TABLET | Freq: Every day | ORAL | 0 refills | Status: DC
  Filled 2022-08-19: qty 30, 30d supply, fill #0

## 2022-08-19 MED ORDER — EMPAGLIFLOZIN 10 MG PO TABS
10.0000 mg | ORAL_TABLET | Freq: Every day | ORAL | 0 refills | Status: DC
  Filled 2022-08-19: qty 30, 30d supply, fill #0

## 2022-08-19 MED ORDER — POTASSIUM CHLORIDE CRYS ER 20 MEQ PO TBCR
20.0000 meq | EXTENDED_RELEASE_TABLET | Freq: Every day | ORAL | 0 refills | Status: DC
  Filled 2022-08-19: qty 30, 30d supply, fill #0

## 2022-08-19 NOTE — Telephone Encounter (Signed)
Called to confirm Heart & Vascular Transitions of Care appointment at 08/20/22. Patient reminded to bring all medications and pill box organizer with them. Gave directions, instructed to utilize [place] parking.  Left message to confirm appointment

## 2022-08-19 NOTE — Progress Notes (Signed)
   Heart and Vascular Center Transitions of Care Clinic Heart Failure Pharmacist Encounter  PCP: Kristen Loader, FNP PCP-Cardiologist: None  HPI:  57 yo male with PMH significant for GERD, obesity, OSA on CPAP, and CKD stage 3a. Pt was admitted to [facility] hospital 12/25-12/29/2023 for acute respiratory failure secondary to influenza. He had noticed increasing edema in the week following admission. CXR 06/27/2022 showed no active cardiopulmonary disease. ECHO on 06/30/2022 showed EF 55-60% with grade I diastolic dysfunction.   At last visit with the [facility] clinic on 07/16/2022, patient reported SOB with exertion and general fatigue. He presented with RLE and reported wearing compression socks. He endorsed medication adherence. Soft BP at that visit limited additional GDMT initiation. He was instructed to follow back up in clinic for a fluid check prior to referral to general cardiology.   Today, Marvin Gonzalez presents to the [facility] for follow up. He denies SOB, edema, or orthopnea. He has started working out again about 4-5 days a week. Endorses medication adherence.  HF Medications: Diuretic: furosemide 8m once daily SGLT2i: Jardiance 118monce daily  Has the patient been experiencing any side effects to the medications prescribed?  no  Does the patient have any problems obtaining medications due to transportation or finances?   no  Understanding of regimen: good Understanding of indications: good Potential of compliance: good Patient understands to avoid NSAIDs. Patient understands to avoid decongestants.  Vital Signs: Weight: 293 lbs (Last OV weight: 296 lbs) Blood pressure: 118/78  Heart rate: 77   Medication Assistance / Insurance Benefits Check: Does the patient have prescription insurance?  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 NYHA class II symptoms. - Continue Jardiance 1044mnce daily - Continue furosemide 29m69mce daily  Plan: 1) Medication changes: -  None at this time  2) Follow up: - with cardiology  AllyMaryan PulsarmD PGY-1 [facility] Resident

## 2022-08-20 ENCOUNTER — Encounter (HOSPITAL_COMMUNITY): Payer: Self-pay

## 2022-08-20 ENCOUNTER — Other Ambulatory Visit (HOSPITAL_COMMUNITY): Payer: Self-pay

## 2022-08-20 ENCOUNTER — Ambulatory Visit (HOSPITAL_COMMUNITY)
Admission: RE | Admit: 2022-08-20 | Discharge: 2022-08-20 | Disposition: A | Discharge status: Home / Self Care | Payer: No Typology Code available for payment source | Source: Ambulatory Visit | Attending: Adult Health | Admitting: Adult Health

## 2022-08-20 VITALS — BP 118/78 | HR 77 | Wt 293.0 lb

## 2022-08-20 DIAGNOSIS — Z7984 Long term (current) use of oral hypoglycemic drugs: Secondary | ICD-10-CM | POA: Insufficient documentation

## 2022-08-20 DIAGNOSIS — G4733 Obstructive sleep apnea (adult) (pediatric): Secondary | ICD-10-CM

## 2022-08-20 DIAGNOSIS — N183 Chronic kidney disease, stage 3 unspecified: Secondary | ICD-10-CM | POA: Diagnosis not present

## 2022-08-20 DIAGNOSIS — Z79899 Other long term (current) drug therapy: Secondary | ICD-10-CM | POA: Diagnosis not present

## 2022-08-20 DIAGNOSIS — N1831 Chronic kidney disease, stage 3a: Secondary | ICD-10-CM | POA: Insufficient documentation

## 2022-08-20 DIAGNOSIS — I5032 Chronic diastolic (congestive) heart failure: Secondary | ICD-10-CM | POA: Diagnosis not present

## 2022-08-20 DIAGNOSIS — Z8249 Family history of ischemic heart disease and other diseases of the circulatory system: Secondary | ICD-10-CM | POA: Insufficient documentation

## 2022-08-20 DIAGNOSIS — E669 Obesity, unspecified: Secondary | ICD-10-CM | POA: Diagnosis not present

## 2022-08-20 DIAGNOSIS — Z6841 Body Mass Index (BMI) 40.0 and over, adult: Secondary | ICD-10-CM | POA: Diagnosis not present

## 2022-08-20 DIAGNOSIS — K219 Gastro-esophageal reflux disease without esophagitis: Secondary | ICD-10-CM | POA: Insufficient documentation

## 2022-08-20 LAB — BASIC METABOLIC PANEL
Anion gap: 8 (ref 5–15)
BUN: 18 mg/dL (ref 6–20)
CO2: 26 mmol/L (ref 22–32)
Calcium: 8.9 mg/dL (ref 8.9–10.3)
Chloride: 103 mmol/L (ref 98–111)
Creatinine, Ser: 1.42 mg/dL — ABNORMAL HIGH (ref 0.61–1.24)
GFR, Estimated: 58 mL/min — ABNORMAL LOW (ref >60)
Glucose, Bld: 106 mg/dL — ABNORMAL HIGH (ref 70–99)
Potassium: 3.8 mmol/L (ref 3.5–5.1)
Sodium: 137 mmol/L (ref 135–145)

## 2022-08-20 NOTE — Patient Instructions (Signed)
It was great to see you today! No medication changes are needed at this time.    Thank you for allowing [place] to provider your heart failure care after your recent hospitalization. Please follow-up with general cardiology       At the [facility], you and your health needs are our priority. As part of our continuing mission to provide you with exceptional heart care, we have created designated Provider Care Teams. These Care Teams include your primary Cardiologist (physician) and Advanced Practice Providers (APPs- Physician Assistants and Nurse Practitioners) who all work together to provide you with the care you need, when you need it.   You may see any of the following providers on your designated Care Team at your next follow up: Dr Glori Bickers Dr Loralie Champagne Dr. Roxana Hires, NP Lyda Jester, [place] [facility] [place], [place] Forestine Na, NP Audry Riles, PharmD   Please be sure to bring in all your medications bottles to every appointment.    Thank you for choosing [facility]

## 2022-09-22 ENCOUNTER — Other Ambulatory Visit (HOSPITAL_COMMUNITY): Payer: Self-pay | Admitting: Family Medicine

## 2022-09-23 ENCOUNTER — Other Ambulatory Visit (HOSPITAL_COMMUNITY): Payer: Self-pay

## 2022-09-23 MED ORDER — POTASSIUM CHLORIDE CRYS ER 20 MEQ PO TBCR
20.0000 meq | EXTENDED_RELEASE_TABLET | Freq: Every day | ORAL | 0 refills | Status: DC
  Filled 2022-09-23: qty 30, 30d supply, fill #0

## 2022-09-23 MED ORDER — EMPAGLIFLOZIN 10 MG PO TABS
10.0000 mg | ORAL_TABLET | Freq: Every day | ORAL | 0 refills | Status: DC
  Filled 2022-09-23: qty 30, 30d supply, fill #0

## 2022-09-23 MED ORDER — FUROSEMIDE 40 MG PO TABS
40.0000 mg | ORAL_TABLET | Freq: Every day | ORAL | 0 refills | Status: DC
  Filled 2022-09-23: qty 30, 30d supply, fill #0

## 2022-10-27 ENCOUNTER — Other Ambulatory Visit (HOSPITAL_COMMUNITY): Payer: Self-pay | Admitting: Family Medicine

## 2022-10-28 ENCOUNTER — Other Ambulatory Visit (HOSPITAL_COMMUNITY): Payer: Self-pay

## 2022-10-28 MED ORDER — POTASSIUM CHLORIDE CRYS ER 20 MEQ PO TBCR
20.0000 meq | EXTENDED_RELEASE_TABLET | Freq: Every day | ORAL | 0 refills | Status: DC
  Filled 2022-10-28: qty 30, 30d supply, fill #0

## 2022-10-28 MED ORDER — EMPAGLIFLOZIN 10 MG PO TABS
10.0000 mg | ORAL_TABLET | Freq: Every day | ORAL | 0 refills | Status: DC
  Filled 2022-10-28: qty 30, 30d supply, fill #0

## 2022-10-28 MED ORDER — FUROSEMIDE 40 MG PO TABS
40.0000 mg | ORAL_TABLET | Freq: Every day | ORAL | 0 refills | Status: DC
  Filled 2022-10-28: qty 30, 30d supply, fill #0

## 2022-10-30 ENCOUNTER — Other Ambulatory Visit (HOSPITAL_COMMUNITY): Payer: Self-pay

## 2022-12-01 ENCOUNTER — Other Ambulatory Visit (HOSPITAL_COMMUNITY): Payer: Self-pay

## 2022-12-01 ENCOUNTER — Other Ambulatory Visit (HOSPITAL_COMMUNITY): Payer: Self-pay | Admitting: Family Medicine

## 2022-12-01 MED ORDER — EMPAGLIFLOZIN 10 MG PO TABS
10.0000 mg | ORAL_TABLET | Freq: Every day | ORAL | 0 refills | Status: DC
  Filled 2022-12-01: qty 30, 30d supply, fill #0

## 2022-12-01 MED ORDER — POTASSIUM CHLORIDE CRYS ER 20 MEQ PO TBCR
20.0000 meq | EXTENDED_RELEASE_TABLET | Freq: Every day | ORAL | 0 refills | Status: DC
  Filled 2022-12-01: qty 30, 30d supply, fill #0

## 2022-12-01 MED ORDER — FUROSEMIDE 40 MG PO TABS
40.0000 mg | ORAL_TABLET | Freq: Every day | ORAL | 0 refills | Status: DC
  Filled 2022-12-01: qty 30, 30d supply, fill #0

## 2023-01-05 ENCOUNTER — Other Ambulatory Visit (HOSPITAL_COMMUNITY): Payer: Self-pay | Admitting: Family Medicine

## 2023-01-05 ENCOUNTER — Other Ambulatory Visit (HOSPITAL_COMMUNITY): Payer: Self-pay

## 2023-01-05 MED ORDER — FUROSEMIDE 40 MG PO TABS
40.0000 mg | ORAL_TABLET | Freq: Every day | ORAL | 0 refills | Status: DC
  Filled 2023-01-05: qty 30, 30d supply, fill #0

## 2023-01-05 MED ORDER — EMPAGLIFLOZIN 10 MG PO TABS
10.0000 mg | ORAL_TABLET | Freq: Every day | ORAL | 0 refills | Status: DC
  Filled 2023-01-05: qty 30, 30d supply, fill #0

## 2023-01-05 MED ORDER — POTASSIUM CHLORIDE CRYS ER 20 MEQ PO TBCR
20.0000 meq | EXTENDED_RELEASE_TABLET | Freq: Every day | ORAL | 0 refills | Status: DC
  Filled 2023-01-05: qty 30, 30d supply, fill #0

## 2023-02-11 ENCOUNTER — Other Ambulatory Visit (HOSPITAL_COMMUNITY): Payer: Self-pay | Admitting: Family Medicine

## 2023-02-11 ENCOUNTER — Other Ambulatory Visit (HOSPITAL_COMMUNITY): Payer: Self-pay

## 2023-02-11 MED FILL — Furosemide Tab 40 MG: ORAL | 30 days supply | Qty: 30 | Fill #0 | Status: AC

## 2023-02-11 MED FILL — Empagliflozin Tab 10 MG: ORAL | 30 days supply | Qty: 30 | Fill #0 | Status: AC

## 2023-02-11 MED FILL — Potassium Chloride Microencapsulated Crys ER Tab 20 mEq: ORAL | 30 days supply | Qty: 30 | Fill #0 | Status: AC

## 2023-02-12 ENCOUNTER — Other Ambulatory Visit (HOSPITAL_COMMUNITY): Payer: Self-pay

## 2023-02-22 ENCOUNTER — Other Ambulatory Visit: Payer: Self-pay

## 2023-02-22 ENCOUNTER — Other Ambulatory Visit (HOSPITAL_COMMUNITY): Payer: Self-pay

## 2023-02-22 MED ORDER — FUROSEMIDE 40 MG PO TABS
40.0000 mg | ORAL_TABLET | Freq: Every day | ORAL | 0 refills | Status: DC
  Filled 2023-03-15: qty 30, 30d supply, fill #0

## 2023-02-22 MED ORDER — JARDIANCE 10 MG PO TABS
10.0000 mg | ORAL_TABLET | Freq: Every day | ORAL | 0 refills | Status: DC
  Filled 2023-03-15: qty 30, 30d supply, fill #0

## 2023-02-22 MED ORDER — POTASSIUM CHLORIDE ER 20 MEQ PO TBCR
20.0000 meq | EXTENDED_RELEASE_TABLET | Freq: Every day | ORAL | 0 refills | Status: DC
  Filled 2023-02-22 – 2023-03-15 (×2): qty 30, 30d supply, fill #0

## 2023-03-02 ENCOUNTER — Other Ambulatory Visit (HOSPITAL_COMMUNITY): Payer: Self-pay

## 2023-03-15 ENCOUNTER — Other Ambulatory Visit (HOSPITAL_COMMUNITY): Payer: Self-pay

## 2023-04-14 ENCOUNTER — Other Ambulatory Visit (HOSPITAL_COMMUNITY): Payer: Self-pay | Admitting: Family Medicine

## 2023-04-14 ENCOUNTER — Other Ambulatory Visit (HOSPITAL_COMMUNITY): Payer: Self-pay

## 2023-04-18 ENCOUNTER — Other Ambulatory Visit (HOSPITAL_COMMUNITY): Payer: Self-pay

## 2023-04-18 MED ORDER — JARDIANCE 10 MG PO TABS
10.0000 mg | ORAL_TABLET | Freq: Every day | ORAL | 0 refills | Status: DC
  Filled 2023-04-18: qty 90, 90d supply, fill #0

## 2023-04-18 MED ORDER — FUROSEMIDE 40 MG PO TABS
40.0000 mg | ORAL_TABLET | Freq: Every day | ORAL | 0 refills | Status: DC
  Filled 2023-04-18: qty 90, 90d supply, fill #0

## 2023-04-18 MED ORDER — POTASSIUM CHLORIDE ER 20 MEQ PO TBCR
20.0000 meq | EXTENDED_RELEASE_TABLET | Freq: Every day | ORAL | 0 refills | Status: DC
  Filled 2023-04-18: qty 90, 90d supply, fill #0

## 2023-04-19 ENCOUNTER — Other Ambulatory Visit (HOSPITAL_COMMUNITY): Payer: Self-pay

## 2023-05-24 ENCOUNTER — Other Ambulatory Visit (HOSPITAL_COMMUNITY): Payer: Self-pay

## 2023-07-18 ENCOUNTER — Other Ambulatory Visit (HOSPITAL_COMMUNITY): Payer: Self-pay

## 2023-07-18 MED ORDER — JARDIANCE 10 MG PO TABS
10.0000 mg | ORAL_TABLET | Freq: Every day | ORAL | 1 refills | Status: DC
  Filled 2023-07-18: qty 90, 90d supply, fill #0
  Filled 2023-11-04: qty 90, 90d supply, fill #1

## 2023-07-18 MED ORDER — FUROSEMIDE 40 MG PO TABS
40.0000 mg | ORAL_TABLET | Freq: Every day | ORAL | 1 refills | Status: DC
  Filled 2023-07-18: qty 90, 90d supply, fill #0
  Filled 2023-11-04: qty 90, 90d supply, fill #1

## 2023-07-18 MED ORDER — TERBINAFINE HCL 250 MG PO TABS
250.0000 mg | ORAL_TABLET | Freq: Every day | ORAL | 0 refills | Status: AC
  Filled 2023-07-18: qty 56, 56d supply, fill #0

## 2023-07-18 MED ORDER — POTASSIUM CHLORIDE ER 20 MEQ PO TBCR
20.0000 meq | EXTENDED_RELEASE_TABLET | Freq: Every day | ORAL | 1 refills | Status: AC
  Filled 2023-07-18: qty 90, 90d supply, fill #0
  Filled 2023-11-04: qty 90, 90d supply, fill #1

## 2023-07-19 ENCOUNTER — Other Ambulatory Visit (HOSPITAL_COMMUNITY): Payer: Self-pay

## 2023-11-04 ENCOUNTER — Other Ambulatory Visit (HOSPITAL_COMMUNITY): Payer: Self-pay

## 2023-11-04 ENCOUNTER — Other Ambulatory Visit: Payer: Self-pay

## 2023-11-14 ENCOUNTER — Other Ambulatory Visit (HOSPITAL_COMMUNITY): Payer: Self-pay

## 2024-02-03 ENCOUNTER — Other Ambulatory Visit (HOSPITAL_COMMUNITY): Payer: Self-pay

## 2024-02-06 ENCOUNTER — Other Ambulatory Visit (HOSPITAL_BASED_OUTPATIENT_CLINIC_OR_DEPARTMENT_OTHER): Payer: Self-pay

## 2024-02-06 ENCOUNTER — Other Ambulatory Visit (HOSPITAL_COMMUNITY): Payer: Self-pay

## 2024-02-06 MED ORDER — JARDIANCE 10 MG PO TABS
10.0000 mg | ORAL_TABLET | Freq: Every day | ORAL | 0 refills | Status: DC
  Filled 2024-02-06 (×2): qty 90, 90d supply, fill #0

## 2024-02-06 MED ORDER — FUROSEMIDE 40 MG PO TABS
40.0000 mg | ORAL_TABLET | Freq: Every day | ORAL | 1 refills | Status: AC
  Filled 2024-02-06: qty 90, 90d supply, fill #0
  Filled 2024-05-15: qty 90, 90d supply, fill #1

## 2024-02-07 ENCOUNTER — Other Ambulatory Visit: Payer: Self-pay

## 2024-02-09 ENCOUNTER — Other Ambulatory Visit (HOSPITAL_COMMUNITY): Payer: Self-pay

## 2024-02-24 ENCOUNTER — Other Ambulatory Visit (HOSPITAL_COMMUNITY): Payer: Self-pay

## 2024-02-24 ENCOUNTER — Other Ambulatory Visit (HOSPITAL_COMMUNITY): Payer: Self-pay | Admitting: Family Medicine

## 2024-02-24 MED ORDER — POTASSIUM CHLORIDE CRYS ER 20 MEQ PO TBCR
20.0000 meq | EXTENDED_RELEASE_TABLET | Freq: Every day | ORAL | 0 refills | Status: DC
  Filled 2024-02-24: qty 30, 30d supply, fill #0

## 2024-02-29 ENCOUNTER — Other Ambulatory Visit (HOSPITAL_COMMUNITY): Payer: Self-pay

## 2024-04-06 ENCOUNTER — Other Ambulatory Visit (HOSPITAL_COMMUNITY): Payer: Self-pay | Admitting: Family Medicine

## 2024-04-06 ENCOUNTER — Other Ambulatory Visit (HOSPITAL_COMMUNITY): Payer: Self-pay

## 2024-04-06 MED ORDER — POTASSIUM CHLORIDE CRYS ER 20 MEQ PO TBCR
20.0000 meq | EXTENDED_RELEASE_TABLET | Freq: Every day | ORAL | 0 refills | Status: DC
  Filled 2024-04-06: qty 30, 30d supply, fill #0

## 2024-05-15 ENCOUNTER — Other Ambulatory Visit (HOSPITAL_COMMUNITY): Payer: Self-pay

## 2024-05-15 ENCOUNTER — Other Ambulatory Visit (HOSPITAL_COMMUNITY): Payer: Self-pay | Admitting: Family Medicine

## 2024-05-15 ENCOUNTER — Other Ambulatory Visit: Payer: Self-pay

## 2024-05-15 MED ORDER — POTASSIUM CHLORIDE CRYS ER 20 MEQ PO TBCR
20.0000 meq | EXTENDED_RELEASE_TABLET | Freq: Every day | ORAL | 1 refills | Status: AC
  Filled 2024-05-15: qty 90, 90d supply, fill #0

## 2024-05-16 ENCOUNTER — Other Ambulatory Visit (HOSPITAL_COMMUNITY): Payer: Self-pay

## 2024-05-16 MED ORDER — JARDIANCE 10 MG PO TABS
10.0000 mg | ORAL_TABLET | Freq: Every day | ORAL | 0 refills | Status: AC
  Filled 2024-05-16: qty 90, 90d supply, fill #0

## 2024-05-23 ENCOUNTER — Other Ambulatory Visit (HOSPITAL_COMMUNITY): Payer: Self-pay

## 2024-05-23 MED ORDER — OMEPRAZOLE 20 MG PO CPDR
20.0000 mg | DELAYED_RELEASE_CAPSULE | Freq: Every morning | ORAL | 3 refills | Status: AC
  Filled 2024-05-23 – 2024-06-09 (×2): qty 90, 90d supply, fill #0

## 2024-06-01 ENCOUNTER — Other Ambulatory Visit (HOSPITAL_COMMUNITY): Payer: Self-pay

## 2024-06-09 ENCOUNTER — Other Ambulatory Visit (HOSPITAL_COMMUNITY): Payer: Self-pay

## 2024-06-13 ENCOUNTER — Other Ambulatory Visit (HOSPITAL_COMMUNITY): Payer: Self-pay

## 2024-06-13 MED ORDER — MOUNJARO 5 MG/0.5ML ~~LOC~~ SOAJ
0.5000 mL | SUBCUTANEOUS | 0 refills | Status: AC
  Filled 2024-06-13: qty 2, 28d supply, fill #0

## 2024-07-17 ENCOUNTER — Other Ambulatory Visit: Payer: Self-pay

## 2024-07-17 ENCOUNTER — Other Ambulatory Visit (HOSPITAL_COMMUNITY): Payer: Self-pay

## 2024-07-17 MED ORDER — MOUNJARO 7.5 MG/0.5ML ~~LOC~~ SOAJ
7.5000 mg | SUBCUTANEOUS | 0 refills | Status: AC
  Filled 2024-07-17 – 2024-07-18 (×2): qty 2, 28d supply, fill #0

## 2024-07-18 ENCOUNTER — Other Ambulatory Visit (HOSPITAL_COMMUNITY): Payer: Self-pay
